# Patient Record
Sex: Female | Born: 1935
Health system: Southern US, Community
[De-identification: ages and names within clinical notes are randomized; demographics above are authoritative.]

## PROBLEM LIST (undated history)

## (undated) DIAGNOSIS — I4891 Unspecified atrial fibrillation: Secondary | ICD-10-CM

## (undated) DIAGNOSIS — E785 Hyperlipidemia, unspecified: Secondary | ICD-10-CM

## (undated) DIAGNOSIS — M1990 Unspecified osteoarthritis, unspecified site: Secondary | ICD-10-CM

## (undated) HISTORY — PX: TONSILLECTOMY: SUR1361

## (undated) HISTORY — PX: REPLACEMENT TOTAL KNEE: SUR1224

## (undated) HISTORY — PX: JOINT REPLACEMENT: SHX530

---

## 1998-09-01 HISTORY — PX: COLONOSCOPY: SHX174

## 2006-12-23 ENCOUNTER — Ambulatory Visit (HOSPITAL_COMMUNITY): Admission: RE | Admit: 2006-12-23 | Discharge: 2006-12-23 | Payer: Self-pay | Admitting: *Deleted

## 2007-12-28 ENCOUNTER — Ambulatory Visit (HOSPITAL_COMMUNITY): Admission: RE | Admit: 2007-12-28 | Discharge: 2007-12-28 | Payer: Self-pay | Admitting: Family Medicine

## 2009-02-05 ENCOUNTER — Ambulatory Visit (HOSPITAL_COMMUNITY): Admission: RE | Admit: 2009-02-05 | Discharge: 2009-02-05 | Payer: Self-pay | Admitting: Ophthalmology

## 2009-12-03 ENCOUNTER — Ambulatory Visit (HOSPITAL_COMMUNITY): Admission: RE | Admit: 2009-12-03 | Discharge: 2009-12-03 | Payer: Self-pay | Admitting: Ophthalmology

## 2012-10-12 ENCOUNTER — Encounter (HOSPITAL_COMMUNITY): Payer: Self-pay | Admitting: Pharmacy Technician

## 2012-10-18 ENCOUNTER — Ambulatory Visit (HOSPITAL_COMMUNITY)
Admission: RE | Admit: 2012-10-18 | Discharge: 2012-10-18 | Disposition: A | Discharge status: Home / Self Care | Payer: Medicare Other | Source: Ambulatory Visit | Attending: Ophthalmology | Admitting: Ophthalmology

## 2012-10-18 ENCOUNTER — Encounter (HOSPITAL_COMMUNITY): Payer: Self-pay | Admitting: *Deleted

## 2012-10-18 ENCOUNTER — Encounter (HOSPITAL_COMMUNITY)
Admission: RE | Disposition: A | Discharge status: Home / Self Care | Payer: Self-pay | Source: Ambulatory Visit | Attending: Ophthalmology

## 2012-10-18 DIAGNOSIS — H26499 Other secondary cataract, unspecified eye: Secondary | ICD-10-CM | POA: Insufficient documentation

## 2012-10-18 HISTORY — PX: YAG LASER APPLICATION: SHX6189

## 2012-10-18 HISTORY — DX: Unspecified osteoarthritis, unspecified site: M19.90

## 2012-10-18 HISTORY — DX: Hyperlipidemia, unspecified: E78.5

## 2012-10-18 SURGERY — TREATMENT, USING YAG LASER
Anesthesia: Topical | Laterality: Right

## 2012-10-18 MED ORDER — TROPICAMIDE 1 % OP SOLN
OPHTHALMIC | Status: AC
  Filled 2012-10-18: qty 3

## 2012-10-18 MED ORDER — APRACLONIDINE HCL 1 % OP SOLN
OPHTHALMIC | Status: AC
  Filled 2012-10-18: qty 0.1

## 2012-10-18 MED ORDER — TETRACAINE HCL 0.5 % OP SOLN
1.0000 [drp] | Freq: Once | OPHTHALMIC | Status: AC
  Administered 2012-10-18: 1 [drp] via OPHTHALMIC

## 2012-10-18 MED ORDER — TROPICAMIDE 1 % OP SOLN
1.0000 [drp] | OPHTHALMIC | Status: AC
  Administered 2012-10-18 (×3): 1 [drp] via OPHTHALMIC

## 2012-10-18 MED ORDER — TETRACAINE HCL 0.5 % OP SOLN
OPHTHALMIC | Status: AC
  Filled 2012-10-18: qty 2

## 2012-10-18 MED ORDER — APRACLONIDINE HCL 1 % OP SOLN
1.0000 [drp] | OPHTHALMIC | Status: AC
  Administered 2012-10-18 (×2): 1 [drp] via OPHTHALMIC
  Administered 2012-10-18: 12:00:00 via OPHTHALMIC

## 2012-10-18 NOTE — H&P (Signed)
I have reviewed the pre printed H&P, the patient was re-examined, and I have identified no significant interval changes in the patient's medical condition.  There is no change in the plan of care since the history and physical of record. 

## 2012-10-18 NOTE — Brief Op Note (Signed)
Linda Lara 10/18/2012  Susa Simmonds, MD  Yag Laser Self Test Completedyes. Procedure: Posterior Capsulotomy, right eye.  Eye Protection Worn by Staff yes. Laser In Use Sign on Door yes.  Laser: Nd:YAG Spot Size: Fixed Burst Mode: III Power Setting: 2.2-2.6 mJ/burst Position treated: posterior capsule Number of shots: 96 Total energy delivered: 201 mJ  Patency of the capsulotomy was confirmed visually.  The patient tolerated the procedure without difficulty. No complications were encountered.   Patient verbalizes understanding of discharge instructions yes.   Notes:dense fibrous posterior capsule

## 2012-10-18 NOTE — Op Note (Signed)
None required

## 2012-10-20 ENCOUNTER — Encounter (HOSPITAL_COMMUNITY): Payer: Self-pay | Admitting: Ophthalmology

## 2015-10-18 DIAGNOSIS — E782 Mixed hyperlipidemia: Secondary | ICD-10-CM | POA: Diagnosis not present

## 2015-10-18 DIAGNOSIS — I10 Essential (primary) hypertension: Secondary | ICD-10-CM | POA: Diagnosis not present

## 2015-10-18 DIAGNOSIS — N183 Chronic kidney disease, stage 3 (moderate): Secondary | ICD-10-CM | POA: Diagnosis not present

## 2015-10-18 DIAGNOSIS — R5382 Chronic fatigue, unspecified: Secondary | ICD-10-CM | POA: Diagnosis not present

## 2015-10-25 DIAGNOSIS — N183 Chronic kidney disease, stage 3 (moderate): Secondary | ICD-10-CM | POA: Diagnosis not present

## 2015-10-25 DIAGNOSIS — Z6827 Body mass index (BMI) 27.0-27.9, adult: Secondary | ICD-10-CM | POA: Diagnosis not present

## 2015-10-25 DIAGNOSIS — I10 Essential (primary) hypertension: Secondary | ICD-10-CM | POA: Diagnosis not present

## 2015-10-25 DIAGNOSIS — M19041 Primary osteoarthritis, right hand: Secondary | ICD-10-CM | POA: Diagnosis not present

## 2015-10-25 DIAGNOSIS — Z0001 Encounter for general adult medical examination with abnormal findings: Secondary | ICD-10-CM | POA: Diagnosis not present

## 2015-10-25 DIAGNOSIS — E782 Mixed hyperlipidemia: Secondary | ICD-10-CM | POA: Diagnosis not present

## 2015-10-25 DIAGNOSIS — M19042 Primary osteoarthritis, left hand: Secondary | ICD-10-CM | POA: Diagnosis not present

## 2016-03-17 DIAGNOSIS — N183 Chronic kidney disease, stage 3 (moderate): Secondary | ICD-10-CM | POA: Diagnosis not present

## 2016-03-17 DIAGNOSIS — E782 Mixed hyperlipidemia: Secondary | ICD-10-CM | POA: Diagnosis not present

## 2016-03-17 DIAGNOSIS — I10 Essential (primary) hypertension: Secondary | ICD-10-CM | POA: Diagnosis not present

## 2016-03-20 DIAGNOSIS — N183 Chronic kidney disease, stage 3 (moderate): Secondary | ICD-10-CM | POA: Diagnosis not present

## 2016-03-20 DIAGNOSIS — M19042 Primary osteoarthritis, left hand: Secondary | ICD-10-CM | POA: Diagnosis not present

## 2016-03-20 DIAGNOSIS — I10 Essential (primary) hypertension: Secondary | ICD-10-CM | POA: Diagnosis not present

## 2016-03-20 DIAGNOSIS — M19041 Primary osteoarthritis, right hand: Secondary | ICD-10-CM | POA: Diagnosis not present

## 2016-03-20 DIAGNOSIS — Z6827 Body mass index (BMI) 27.0-27.9, adult: Secondary | ICD-10-CM | POA: Diagnosis not present

## 2016-03-20 DIAGNOSIS — E782 Mixed hyperlipidemia: Secondary | ICD-10-CM | POA: Diagnosis not present

## 2016-04-16 DIAGNOSIS — Z961 Presence of intraocular lens: Secondary | ICD-10-CM | POA: Diagnosis not present

## 2016-04-16 DIAGNOSIS — H26492 Other secondary cataract, left eye: Secondary | ICD-10-CM | POA: Diagnosis not present

## 2016-04-16 DIAGNOSIS — H401131 Primary open-angle glaucoma, bilateral, mild stage: Secondary | ICD-10-CM | POA: Diagnosis not present

## 2016-04-21 DIAGNOSIS — H401131 Primary open-angle glaucoma, bilateral, mild stage: Secondary | ICD-10-CM | POA: Diagnosis not present

## 2016-04-28 DIAGNOSIS — H401131 Primary open-angle glaucoma, bilateral, mild stage: Secondary | ICD-10-CM | POA: Diagnosis not present

## 2016-05-08 DIAGNOSIS — Z1231 Encounter for screening mammogram for malignant neoplasm of breast: Secondary | ICD-10-CM | POA: Diagnosis not present

## 2016-05-20 DIAGNOSIS — Z23 Encounter for immunization: Secondary | ICD-10-CM | POA: Diagnosis not present

## 2016-06-26 DIAGNOSIS — H401131 Primary open-angle glaucoma, bilateral, mild stage: Secondary | ICD-10-CM | POA: Diagnosis not present

## 2016-06-26 DIAGNOSIS — Z961 Presence of intraocular lens: Secondary | ICD-10-CM | POA: Diagnosis not present

## 2016-08-19 DIAGNOSIS — Z961 Presence of intraocular lens: Secondary | ICD-10-CM | POA: Diagnosis not present

## 2016-08-19 DIAGNOSIS — H401132 Primary open-angle glaucoma, bilateral, moderate stage: Secondary | ICD-10-CM | POA: Diagnosis not present

## 2016-09-30 DIAGNOSIS — Z961 Presence of intraocular lens: Secondary | ICD-10-CM | POA: Diagnosis not present

## 2016-09-30 DIAGNOSIS — H401132 Primary open-angle glaucoma, bilateral, moderate stage: Secondary | ICD-10-CM | POA: Diagnosis not present

## 2016-10-28 DIAGNOSIS — E782 Mixed hyperlipidemia: Secondary | ICD-10-CM | POA: Diagnosis not present

## 2016-10-28 DIAGNOSIS — N183 Chronic kidney disease, stage 3 (moderate): Secondary | ICD-10-CM | POA: Diagnosis not present

## 2016-10-28 DIAGNOSIS — I10 Essential (primary) hypertension: Secondary | ICD-10-CM | POA: Diagnosis not present

## 2016-10-28 DIAGNOSIS — R5382 Chronic fatigue, unspecified: Secondary | ICD-10-CM | POA: Diagnosis not present

## 2016-10-30 DIAGNOSIS — E782 Mixed hyperlipidemia: Secondary | ICD-10-CM | POA: Diagnosis not present

## 2016-10-30 DIAGNOSIS — Z0001 Encounter for general adult medical examination with abnormal findings: Secondary | ICD-10-CM | POA: Diagnosis not present

## 2016-10-30 DIAGNOSIS — I10 Essential (primary) hypertension: Secondary | ICD-10-CM | POA: Diagnosis not present

## 2016-10-30 DIAGNOSIS — N183 Chronic kidney disease, stage 3 (moderate): Secondary | ICD-10-CM | POA: Diagnosis not present

## 2017-02-03 DIAGNOSIS — Z961 Presence of intraocular lens: Secondary | ICD-10-CM | POA: Diagnosis not present

## 2017-02-03 DIAGNOSIS — H401132 Primary open-angle glaucoma, bilateral, moderate stage: Secondary | ICD-10-CM | POA: Diagnosis not present

## 2017-04-30 DIAGNOSIS — N183 Chronic kidney disease, stage 3 (moderate): Secondary | ICD-10-CM | POA: Diagnosis not present

## 2017-04-30 DIAGNOSIS — I10 Essential (primary) hypertension: Secondary | ICD-10-CM | POA: Diagnosis not present

## 2017-04-30 DIAGNOSIS — Z9189 Other specified personal risk factors, not elsewhere classified: Secondary | ICD-10-CM | POA: Diagnosis not present

## 2017-04-30 DIAGNOSIS — E782 Mixed hyperlipidemia: Secondary | ICD-10-CM | POA: Diagnosis not present

## 2017-05-05 DIAGNOSIS — I10 Essential (primary) hypertension: Secondary | ICD-10-CM | POA: Diagnosis not present

## 2017-05-05 DIAGNOSIS — N183 Chronic kidney disease, stage 3 (moderate): Secondary | ICD-10-CM | POA: Diagnosis not present

## 2017-05-05 DIAGNOSIS — M19042 Primary osteoarthritis, left hand: Secondary | ICD-10-CM | POA: Diagnosis not present

## 2017-05-05 DIAGNOSIS — E782 Mixed hyperlipidemia: Secondary | ICD-10-CM | POA: Diagnosis not present

## 2017-05-22 DIAGNOSIS — Z1231 Encounter for screening mammogram for malignant neoplasm of breast: Secondary | ICD-10-CM | POA: Diagnosis not present

## 2017-06-09 DIAGNOSIS — Z961 Presence of intraocular lens: Secondary | ICD-10-CM | POA: Diagnosis not present

## 2017-06-09 DIAGNOSIS — H31023 Solar retinopathy, bilateral: Secondary | ICD-10-CM | POA: Diagnosis not present

## 2017-06-09 DIAGNOSIS — H401132 Primary open-angle glaucoma, bilateral, moderate stage: Secondary | ICD-10-CM | POA: Diagnosis not present

## 2017-10-12 DIAGNOSIS — H401132 Primary open-angle glaucoma, bilateral, moderate stage: Secondary | ICD-10-CM | POA: Diagnosis not present

## 2017-10-12 DIAGNOSIS — H31023 Solar retinopathy, bilateral: Secondary | ICD-10-CM | POA: Diagnosis not present

## 2017-10-12 DIAGNOSIS — Z961 Presence of intraocular lens: Secondary | ICD-10-CM | POA: Diagnosis not present

## 2017-10-23 DIAGNOSIS — L89151 Pressure ulcer of sacral region, stage 1: Secondary | ICD-10-CM | POA: Diagnosis not present

## 2017-10-23 DIAGNOSIS — Z6827 Body mass index (BMI) 27.0-27.9, adult: Secondary | ICD-10-CM | POA: Diagnosis not present

## 2017-10-23 DIAGNOSIS — L03315 Cellulitis of perineum: Secondary | ICD-10-CM | POA: Diagnosis not present

## 2017-11-20 DIAGNOSIS — I10 Essential (primary) hypertension: Secondary | ICD-10-CM | POA: Diagnosis not present

## 2017-11-20 DIAGNOSIS — R5382 Chronic fatigue, unspecified: Secondary | ICD-10-CM | POA: Diagnosis not present

## 2017-11-20 DIAGNOSIS — E782 Mixed hyperlipidemia: Secondary | ICD-10-CM | POA: Diagnosis not present

## 2017-11-20 DIAGNOSIS — N183 Chronic kidney disease, stage 3 (moderate): Secondary | ICD-10-CM | POA: Diagnosis not present

## 2017-11-25 DIAGNOSIS — Z23 Encounter for immunization: Secondary | ICD-10-CM | POA: Diagnosis not present

## 2017-11-25 DIAGNOSIS — Z0001 Encounter for general adult medical examination with abnormal findings: Secondary | ICD-10-CM | POA: Diagnosis not present

## 2017-11-25 DIAGNOSIS — E782 Mixed hyperlipidemia: Secondary | ICD-10-CM | POA: Diagnosis not present

## 2017-11-25 DIAGNOSIS — I10 Essential (primary) hypertension: Secondary | ICD-10-CM | POA: Diagnosis not present

## 2018-02-17 DIAGNOSIS — Z961 Presence of intraocular lens: Secondary | ICD-10-CM | POA: Diagnosis not present

## 2018-02-17 DIAGNOSIS — H401132 Primary open-angle glaucoma, bilateral, moderate stage: Secondary | ICD-10-CM | POA: Diagnosis not present

## 2018-02-17 DIAGNOSIS — H31023 Solar retinopathy, bilateral: Secondary | ICD-10-CM | POA: Diagnosis not present

## 2018-05-25 DIAGNOSIS — Z9189 Other specified personal risk factors, not elsewhere classified: Secondary | ICD-10-CM | POA: Diagnosis not present

## 2018-05-25 DIAGNOSIS — E782 Mixed hyperlipidemia: Secondary | ICD-10-CM | POA: Diagnosis not present

## 2018-05-25 DIAGNOSIS — I10 Essential (primary) hypertension: Secondary | ICD-10-CM | POA: Diagnosis not present

## 2018-05-25 DIAGNOSIS — N183 Chronic kidney disease, stage 3 (moderate): Secondary | ICD-10-CM | POA: Diagnosis not present

## 2018-05-25 DIAGNOSIS — R5382 Chronic fatigue, unspecified: Secondary | ICD-10-CM | POA: Diagnosis not present

## 2018-05-28 DIAGNOSIS — M19042 Primary osteoarthritis, left hand: Secondary | ICD-10-CM | POA: Diagnosis not present

## 2018-05-28 DIAGNOSIS — N183 Chronic kidney disease, stage 3 (moderate): Secondary | ICD-10-CM | POA: Diagnosis not present

## 2018-05-28 DIAGNOSIS — E782 Mixed hyperlipidemia: Secondary | ICD-10-CM | POA: Diagnosis not present

## 2018-05-28 DIAGNOSIS — I10 Essential (primary) hypertension: Secondary | ICD-10-CM | POA: Diagnosis not present

## 2018-06-04 DIAGNOSIS — Z1231 Encounter for screening mammogram for malignant neoplasm of breast: Secondary | ICD-10-CM | POA: Diagnosis not present

## 2018-08-30 DIAGNOSIS — H401132 Primary open-angle glaucoma, bilateral, moderate stage: Secondary | ICD-10-CM | POA: Diagnosis not present

## 2018-08-30 DIAGNOSIS — Z961 Presence of intraocular lens: Secondary | ICD-10-CM | POA: Diagnosis not present

## 2018-08-30 DIAGNOSIS — H31023 Solar retinopathy, bilateral: Secondary | ICD-10-CM | POA: Diagnosis not present

## 2018-09-14 DIAGNOSIS — M9903 Segmental and somatic dysfunction of lumbar region: Secondary | ICD-10-CM | POA: Diagnosis not present

## 2018-09-14 DIAGNOSIS — S338XXA Sprain of other parts of lumbar spine and pelvis, initial encounter: Secondary | ICD-10-CM | POA: Diagnosis not present

## 2018-09-14 DIAGNOSIS — M47816 Spondylosis without myelopathy or radiculopathy, lumbar region: Secondary | ICD-10-CM | POA: Diagnosis not present

## 2018-09-16 DIAGNOSIS — M9903 Segmental and somatic dysfunction of lumbar region: Secondary | ICD-10-CM | POA: Diagnosis not present

## 2018-09-16 DIAGNOSIS — S338XXA Sprain of other parts of lumbar spine and pelvis, initial encounter: Secondary | ICD-10-CM | POA: Diagnosis not present

## 2018-09-16 DIAGNOSIS — M47816 Spondylosis without myelopathy or radiculopathy, lumbar region: Secondary | ICD-10-CM | POA: Diagnosis not present

## 2018-09-20 DIAGNOSIS — M47816 Spondylosis without myelopathy or radiculopathy, lumbar region: Secondary | ICD-10-CM | POA: Diagnosis not present

## 2018-09-20 DIAGNOSIS — S338XXA Sprain of other parts of lumbar spine and pelvis, initial encounter: Secondary | ICD-10-CM | POA: Diagnosis not present

## 2018-09-20 DIAGNOSIS — M9903 Segmental and somatic dysfunction of lumbar region: Secondary | ICD-10-CM | POA: Diagnosis not present

## 2018-09-23 DIAGNOSIS — S338XXA Sprain of other parts of lumbar spine and pelvis, initial encounter: Secondary | ICD-10-CM | POA: Diagnosis not present

## 2018-09-23 DIAGNOSIS — M9903 Segmental and somatic dysfunction of lumbar region: Secondary | ICD-10-CM | POA: Diagnosis not present

## 2018-09-23 DIAGNOSIS — M47816 Spondylosis without myelopathy or radiculopathy, lumbar region: Secondary | ICD-10-CM | POA: Diagnosis not present

## 2018-09-27 DIAGNOSIS — M47816 Spondylosis without myelopathy or radiculopathy, lumbar region: Secondary | ICD-10-CM | POA: Diagnosis not present

## 2018-09-27 DIAGNOSIS — M9903 Segmental and somatic dysfunction of lumbar region: Secondary | ICD-10-CM | POA: Diagnosis not present

## 2018-09-27 DIAGNOSIS — S338XXA Sprain of other parts of lumbar spine and pelvis, initial encounter: Secondary | ICD-10-CM | POA: Diagnosis not present

## 2018-09-30 DIAGNOSIS — S338XXA Sprain of other parts of lumbar spine and pelvis, initial encounter: Secondary | ICD-10-CM | POA: Diagnosis not present

## 2018-09-30 DIAGNOSIS — M9903 Segmental and somatic dysfunction of lumbar region: Secondary | ICD-10-CM | POA: Diagnosis not present

## 2018-09-30 DIAGNOSIS — M47816 Spondylosis without myelopathy or radiculopathy, lumbar region: Secondary | ICD-10-CM | POA: Diagnosis not present

## 2018-10-04 DIAGNOSIS — M47816 Spondylosis without myelopathy or radiculopathy, lumbar region: Secondary | ICD-10-CM | POA: Diagnosis not present

## 2018-10-04 DIAGNOSIS — S338XXA Sprain of other parts of lumbar spine and pelvis, initial encounter: Secondary | ICD-10-CM | POA: Diagnosis not present

## 2018-10-04 DIAGNOSIS — M9903 Segmental and somatic dysfunction of lumbar region: Secondary | ICD-10-CM | POA: Diagnosis not present

## 2018-10-11 DIAGNOSIS — S338XXA Sprain of other parts of lumbar spine and pelvis, initial encounter: Secondary | ICD-10-CM | POA: Diagnosis not present

## 2018-10-11 DIAGNOSIS — M47816 Spondylosis without myelopathy or radiculopathy, lumbar region: Secondary | ICD-10-CM | POA: Diagnosis not present

## 2018-10-11 DIAGNOSIS — M9903 Segmental and somatic dysfunction of lumbar region: Secondary | ICD-10-CM | POA: Diagnosis not present

## 2018-10-15 DIAGNOSIS — M47816 Spondylosis without myelopathy or radiculopathy, lumbar region: Secondary | ICD-10-CM | POA: Diagnosis not present

## 2018-10-15 DIAGNOSIS — S338XXA Sprain of other parts of lumbar spine and pelvis, initial encounter: Secondary | ICD-10-CM | POA: Diagnosis not present

## 2018-10-15 DIAGNOSIS — M9903 Segmental and somatic dysfunction of lumbar region: Secondary | ICD-10-CM | POA: Diagnosis not present

## 2018-10-18 DIAGNOSIS — M9903 Segmental and somatic dysfunction of lumbar region: Secondary | ICD-10-CM | POA: Diagnosis not present

## 2018-10-18 DIAGNOSIS — M47816 Spondylosis without myelopathy or radiculopathy, lumbar region: Secondary | ICD-10-CM | POA: Diagnosis not present

## 2018-10-18 DIAGNOSIS — S338XXA Sprain of other parts of lumbar spine and pelvis, initial encounter: Secondary | ICD-10-CM | POA: Diagnosis not present

## 2018-10-21 DIAGNOSIS — S338XXA Sprain of other parts of lumbar spine and pelvis, initial encounter: Secondary | ICD-10-CM | POA: Diagnosis not present

## 2018-10-21 DIAGNOSIS — M47816 Spondylosis without myelopathy or radiculopathy, lumbar region: Secondary | ICD-10-CM | POA: Diagnosis not present

## 2018-10-21 DIAGNOSIS — M9903 Segmental and somatic dysfunction of lumbar region: Secondary | ICD-10-CM | POA: Diagnosis not present

## 2018-10-25 DIAGNOSIS — M47816 Spondylosis without myelopathy or radiculopathy, lumbar region: Secondary | ICD-10-CM | POA: Diagnosis not present

## 2018-10-25 DIAGNOSIS — S338XXA Sprain of other parts of lumbar spine and pelvis, initial encounter: Secondary | ICD-10-CM | POA: Diagnosis not present

## 2018-10-25 DIAGNOSIS — M9903 Segmental and somatic dysfunction of lumbar region: Secondary | ICD-10-CM | POA: Diagnosis not present

## 2018-11-01 DIAGNOSIS — S338XXA Sprain of other parts of lumbar spine and pelvis, initial encounter: Secondary | ICD-10-CM | POA: Diagnosis not present

## 2018-11-01 DIAGNOSIS — M9903 Segmental and somatic dysfunction of lumbar region: Secondary | ICD-10-CM | POA: Diagnosis not present

## 2018-11-01 DIAGNOSIS — M47816 Spondylosis without myelopathy or radiculopathy, lumbar region: Secondary | ICD-10-CM | POA: Diagnosis not present

## 2018-11-08 DIAGNOSIS — M9903 Segmental and somatic dysfunction of lumbar region: Secondary | ICD-10-CM | POA: Diagnosis not present

## 2018-11-08 DIAGNOSIS — M9901 Segmental and somatic dysfunction of cervical region: Secondary | ICD-10-CM | POA: Diagnosis not present

## 2018-11-08 DIAGNOSIS — M9902 Segmental and somatic dysfunction of thoracic region: Secondary | ICD-10-CM | POA: Diagnosis not present

## 2018-11-08 DIAGNOSIS — M47812 Spondylosis without myelopathy or radiculopathy, cervical region: Secondary | ICD-10-CM | POA: Diagnosis not present

## 2018-11-15 DIAGNOSIS — M9901 Segmental and somatic dysfunction of cervical region: Secondary | ICD-10-CM | POA: Diagnosis not present

## 2018-11-15 DIAGNOSIS — M9902 Segmental and somatic dysfunction of thoracic region: Secondary | ICD-10-CM | POA: Diagnosis not present

## 2018-11-15 DIAGNOSIS — M47812 Spondylosis without myelopathy or radiculopathy, cervical region: Secondary | ICD-10-CM | POA: Diagnosis not present

## 2018-11-15 DIAGNOSIS — M9903 Segmental and somatic dysfunction of lumbar region: Secondary | ICD-10-CM | POA: Diagnosis not present

## 2018-11-29 DIAGNOSIS — M9902 Segmental and somatic dysfunction of thoracic region: Secondary | ICD-10-CM | POA: Diagnosis not present

## 2018-11-29 DIAGNOSIS — M9901 Segmental and somatic dysfunction of cervical region: Secondary | ICD-10-CM | POA: Diagnosis not present

## 2018-11-29 DIAGNOSIS — I10 Essential (primary) hypertension: Secondary | ICD-10-CM | POA: Diagnosis not present

## 2018-11-29 DIAGNOSIS — M47812 Spondylosis without myelopathy or radiculopathy, cervical region: Secondary | ICD-10-CM | POA: Diagnosis not present

## 2018-11-29 DIAGNOSIS — E782 Mixed hyperlipidemia: Secondary | ICD-10-CM | POA: Diagnosis not present

## 2018-11-29 DIAGNOSIS — N183 Chronic kidney disease, stage 3 (moderate): Secondary | ICD-10-CM | POA: Diagnosis not present

## 2018-11-29 DIAGNOSIS — E042 Nontoxic multinodular goiter: Secondary | ICD-10-CM | POA: Diagnosis not present

## 2018-11-29 DIAGNOSIS — M9903 Segmental and somatic dysfunction of lumbar region: Secondary | ICD-10-CM | POA: Diagnosis not present

## 2018-12-03 DIAGNOSIS — Z0001 Encounter for general adult medical examination with abnormal findings: Secondary | ICD-10-CM | POA: Diagnosis not present

## 2018-12-03 DIAGNOSIS — I10 Essential (primary) hypertension: Secondary | ICD-10-CM | POA: Diagnosis not present

## 2018-12-03 DIAGNOSIS — M19042 Primary osteoarthritis, left hand: Secondary | ICD-10-CM | POA: Diagnosis not present

## 2018-12-03 DIAGNOSIS — N183 Chronic kidney disease, stage 3 (moderate): Secondary | ICD-10-CM | POA: Diagnosis not present

## 2018-12-06 DIAGNOSIS — M9901 Segmental and somatic dysfunction of cervical region: Secondary | ICD-10-CM | POA: Diagnosis not present

## 2018-12-06 DIAGNOSIS — M9902 Segmental and somatic dysfunction of thoracic region: Secondary | ICD-10-CM | POA: Diagnosis not present

## 2018-12-06 DIAGNOSIS — M47812 Spondylosis without myelopathy or radiculopathy, cervical region: Secondary | ICD-10-CM | POA: Diagnosis not present

## 2018-12-06 DIAGNOSIS — M9903 Segmental and somatic dysfunction of lumbar region: Secondary | ICD-10-CM | POA: Diagnosis not present

## 2019-02-24 DIAGNOSIS — R1011 Right upper quadrant pain: Secondary | ICD-10-CM | POA: Diagnosis not present

## 2019-02-24 DIAGNOSIS — Z6827 Body mass index (BMI) 27.0-27.9, adult: Secondary | ICD-10-CM | POA: Diagnosis not present

## 2019-03-01 DIAGNOSIS — H401132 Primary open-angle glaucoma, bilateral, moderate stage: Secondary | ICD-10-CM | POA: Diagnosis not present

## 2019-03-01 DIAGNOSIS — K828 Other specified diseases of gallbladder: Secondary | ICD-10-CM | POA: Diagnosis not present

## 2019-03-01 DIAGNOSIS — N261 Atrophy of kidney (terminal): Secondary | ICD-10-CM | POA: Diagnosis not present

## 2019-03-01 DIAGNOSIS — H31023 Solar retinopathy, bilateral: Secondary | ICD-10-CM | POA: Diagnosis not present

## 2019-03-01 DIAGNOSIS — K8020 Calculus of gallbladder without cholecystitis without obstruction: Secondary | ICD-10-CM | POA: Diagnosis not present

## 2019-03-01 DIAGNOSIS — N2889 Other specified disorders of kidney and ureter: Secondary | ICD-10-CM | POA: Diagnosis not present

## 2019-03-01 DIAGNOSIS — Z961 Presence of intraocular lens: Secondary | ICD-10-CM | POA: Diagnosis not present

## 2019-03-01 DIAGNOSIS — R1011 Right upper quadrant pain: Secondary | ICD-10-CM | POA: Diagnosis not present

## 2019-03-02 HISTORY — PX: CHOLECYSTECTOMY: SHX55

## 2019-03-02 HISTORY — PX: ERCP: SHX60

## 2019-03-07 DIAGNOSIS — K8050 Calculus of bile duct without cholangitis or cholecystitis without obstruction: Secondary | ICD-10-CM | POA: Diagnosis not present

## 2019-03-09 DIAGNOSIS — M810 Age-related osteoporosis without current pathological fracture: Secondary | ICD-10-CM | POA: Diagnosis not present

## 2019-03-09 DIAGNOSIS — M1990 Unspecified osteoarthritis, unspecified site: Secondary | ICD-10-CM | POA: Diagnosis not present

## 2019-03-09 DIAGNOSIS — Z1159 Encounter for screening for other viral diseases: Secondary | ICD-10-CM | POA: Diagnosis not present

## 2019-03-09 DIAGNOSIS — I10 Essential (primary) hypertension: Secondary | ICD-10-CM | POA: Diagnosis not present

## 2019-03-09 DIAGNOSIS — K8046 Calculus of bile duct with acute and chronic cholecystitis without obstruction: Secondary | ICD-10-CM | POA: Diagnosis not present

## 2019-03-09 DIAGNOSIS — Z888 Allergy status to other drugs, medicaments and biological substances status: Secondary | ICD-10-CM | POA: Diagnosis not present

## 2019-03-09 DIAGNOSIS — Z79899 Other long term (current) drug therapy: Secondary | ICD-10-CM | POA: Diagnosis not present

## 2019-03-09 DIAGNOSIS — Z882 Allergy status to sulfonamides status: Secondary | ICD-10-CM | POA: Diagnosis not present

## 2019-03-09 DIAGNOSIS — E7800 Pure hypercholesterolemia, unspecified: Secondary | ICD-10-CM | POA: Diagnosis not present

## 2019-03-09 DIAGNOSIS — Z96653 Presence of artificial knee joint, bilateral: Secondary | ICD-10-CM | POA: Diagnosis not present

## 2019-03-11 DIAGNOSIS — K8050 Calculus of bile duct without cholangitis or cholecystitis without obstruction: Secondary | ICD-10-CM | POA: Diagnosis not present

## 2019-03-11 DIAGNOSIS — K8010 Calculus of gallbladder with chronic cholecystitis without obstruction: Secondary | ICD-10-CM | POA: Diagnosis not present

## 2019-03-11 DIAGNOSIS — I10 Essential (primary) hypertension: Secondary | ICD-10-CM | POA: Diagnosis not present

## 2019-03-11 DIAGNOSIS — K812 Acute cholecystitis with chronic cholecystitis: Secondary | ICD-10-CM | POA: Diagnosis not present

## 2019-03-11 DIAGNOSIS — M810 Age-related osteoporosis without current pathological fracture: Secondary | ICD-10-CM | POA: Diagnosis not present

## 2019-03-11 DIAGNOSIS — K8046 Calculus of bile duct with acute and chronic cholecystitis without obstruction: Secondary | ICD-10-CM | POA: Diagnosis not present

## 2019-03-11 DIAGNOSIS — Z79899 Other long term (current) drug therapy: Secondary | ICD-10-CM | POA: Diagnosis not present

## 2019-03-11 DIAGNOSIS — E7800 Pure hypercholesterolemia, unspecified: Secondary | ICD-10-CM | POA: Diagnosis not present

## 2019-03-11 DIAGNOSIS — M1990 Unspecified osteoarthritis, unspecified site: Secondary | ICD-10-CM | POA: Diagnosis not present

## 2019-03-11 DIAGNOSIS — Z882 Allergy status to sulfonamides status: Secondary | ICD-10-CM | POA: Diagnosis not present

## 2019-03-11 DIAGNOSIS — Z96653 Presence of artificial knee joint, bilateral: Secondary | ICD-10-CM | POA: Diagnosis not present

## 2019-03-11 DIAGNOSIS — Z888 Allergy status to other drugs, medicaments and biological substances status: Secondary | ICD-10-CM | POA: Diagnosis not present

## 2019-03-12 DIAGNOSIS — E7800 Pure hypercholesterolemia, unspecified: Secondary | ICD-10-CM | POA: Diagnosis not present

## 2019-03-12 DIAGNOSIS — I10 Essential (primary) hypertension: Secondary | ICD-10-CM | POA: Diagnosis not present

## 2019-03-15 DIAGNOSIS — K851 Biliary acute pancreatitis without necrosis or infection: Secondary | ICD-10-CM | POA: Diagnosis not present

## 2019-03-15 DIAGNOSIS — R52 Pain, unspecified: Secondary | ICD-10-CM | POA: Diagnosis not present

## 2019-03-15 DIAGNOSIS — J189 Pneumonia, unspecified organism: Secondary | ICD-10-CM | POA: Diagnosis not present

## 2019-03-15 DIAGNOSIS — Z209 Contact with and (suspected) exposure to unspecified communicable disease: Secondary | ICD-10-CM | POA: Diagnosis not present

## 2019-03-15 DIAGNOSIS — K8050 Calculus of bile duct without cholangitis or cholecystitis without obstruction: Secondary | ICD-10-CM | POA: Diagnosis not present

## 2019-03-15 DIAGNOSIS — K838 Other specified diseases of biliary tract: Secondary | ICD-10-CM | POA: Diagnosis not present

## 2019-03-15 DIAGNOSIS — K8051 Calculus of bile duct without cholangitis or cholecystitis with obstruction: Secondary | ICD-10-CM | POA: Diagnosis not present

## 2019-03-15 DIAGNOSIS — E871 Hypo-osmolality and hyponatremia: Secondary | ICD-10-CM | POA: Diagnosis not present

## 2019-03-15 DIAGNOSIS — K8511 Biliary acute pancreatitis with uninfected necrosis: Secondary | ICD-10-CM | POA: Diagnosis not present

## 2019-03-15 DIAGNOSIS — R069 Unspecified abnormalities of breathing: Secondary | ICD-10-CM | POA: Diagnosis not present

## 2019-03-15 DIAGNOSIS — K8020 Calculus of gallbladder without cholecystitis without obstruction: Secondary | ICD-10-CM | POA: Diagnosis not present

## 2019-03-15 DIAGNOSIS — I10 Essential (primary) hypertension: Secondary | ICD-10-CM | POA: Diagnosis not present

## 2019-03-15 DIAGNOSIS — E785 Hyperlipidemia, unspecified: Secondary | ICD-10-CM | POA: Diagnosis not present

## 2019-03-15 DIAGNOSIS — I700 Atherosclerosis of aorta: Secondary | ICD-10-CM | POA: Diagnosis not present

## 2019-03-15 DIAGNOSIS — R1084 Generalized abdominal pain: Secondary | ICD-10-CM | POA: Diagnosis not present

## 2019-03-15 DIAGNOSIS — H409 Unspecified glaucoma: Secondary | ICD-10-CM | POA: Diagnosis not present

## 2019-03-16 DIAGNOSIS — I10 Essential (primary) hypertension: Secondary | ICD-10-CM | POA: Diagnosis not present

## 2019-03-16 DIAGNOSIS — R935 Abnormal findings on diagnostic imaging of other abdominal regions, including retroperitoneum: Secondary | ICD-10-CM | POA: Diagnosis not present

## 2019-03-16 DIAGNOSIS — K8051 Calculus of bile duct without cholangitis or cholecystitis with obstruction: Secondary | ICD-10-CM | POA: Diagnosis not present

## 2019-03-16 DIAGNOSIS — K838 Other specified diseases of biliary tract: Secondary | ICD-10-CM | POA: Diagnosis not present

## 2019-03-16 DIAGNOSIS — E785 Hyperlipidemia, unspecified: Secondary | ICD-10-CM | POA: Diagnosis not present

## 2019-03-16 DIAGNOSIS — R1011 Right upper quadrant pain: Secondary | ICD-10-CM | POA: Diagnosis not present

## 2019-03-16 DIAGNOSIS — K8511 Biliary acute pancreatitis with uninfected necrosis: Secondary | ICD-10-CM | POA: Diagnosis not present

## 2019-03-16 DIAGNOSIS — H409 Unspecified glaucoma: Secondary | ICD-10-CM | POA: Diagnosis not present

## 2019-03-16 DIAGNOSIS — Z9889 Other specified postprocedural states: Secondary | ICD-10-CM | POA: Diagnosis not present

## 2019-03-16 DIAGNOSIS — K589 Irritable bowel syndrome without diarrhea: Secondary | ICD-10-CM | POA: Diagnosis not present

## 2019-03-16 DIAGNOSIS — K449 Diaphragmatic hernia without obstruction or gangrene: Secondary | ICD-10-CM | POA: Diagnosis not present

## 2019-03-16 DIAGNOSIS — K8050 Calculus of bile duct without cholangitis or cholecystitis without obstruction: Secondary | ICD-10-CM | POA: Diagnosis not present

## 2019-03-16 DIAGNOSIS — K831 Obstruction of bile duct: Secondary | ICD-10-CM | POA: Diagnosis not present

## 2019-03-16 DIAGNOSIS — Z1159 Encounter for screening for other viral diseases: Secondary | ICD-10-CM | POA: Diagnosis not present

## 2019-04-01 DIAGNOSIS — R7989 Other specified abnormal findings of blood chemistry: Secondary | ICD-10-CM | POA: Diagnosis not present

## 2019-04-03 DIAGNOSIS — Z882 Allergy status to sulfonamides status: Secondary | ICD-10-CM | POA: Diagnosis not present

## 2019-04-03 DIAGNOSIS — K529 Noninfective gastroenteritis and colitis, unspecified: Secondary | ICD-10-CM | POA: Diagnosis not present

## 2019-04-03 DIAGNOSIS — Z885 Allergy status to narcotic agent status: Secondary | ICD-10-CM | POA: Diagnosis not present

## 2019-04-03 DIAGNOSIS — R197 Diarrhea, unspecified: Secondary | ICD-10-CM | POA: Diagnosis not present

## 2019-04-03 DIAGNOSIS — I10 Essential (primary) hypertension: Secondary | ICD-10-CM | POA: Diagnosis not present

## 2019-04-03 DIAGNOSIS — E860 Dehydration: Secondary | ICD-10-CM | POA: Diagnosis not present

## 2019-04-03 DIAGNOSIS — E7800 Pure hypercholesterolemia, unspecified: Secondary | ICD-10-CM | POA: Diagnosis not present

## 2019-04-03 DIAGNOSIS — M810 Age-related osteoporosis without current pathological fracture: Secondary | ICD-10-CM | POA: Diagnosis not present

## 2019-04-17 DIAGNOSIS — K8050 Calculus of bile duct without cholangitis or cholecystitis without obstruction: Secondary | ICD-10-CM | POA: Diagnosis not present

## 2019-05-06 DIAGNOSIS — Z23 Encounter for immunization: Secondary | ICD-10-CM | POA: Diagnosis not present

## 2019-05-06 DIAGNOSIS — R109 Unspecified abdominal pain: Secondary | ICD-10-CM | POA: Diagnosis not present

## 2019-05-06 DIAGNOSIS — Z6826 Body mass index (BMI) 26.0-26.9, adult: Secondary | ICD-10-CM | POA: Diagnosis not present

## 2019-05-17 ENCOUNTER — Ambulatory Visit: Payer: Medicare Other | Admitting: Gastroenterology

## 2019-05-17 ENCOUNTER — Encounter: Payer: Self-pay | Admitting: Gastroenterology

## 2019-05-17 ENCOUNTER — Other Ambulatory Visit: Payer: Self-pay

## 2019-05-17 DIAGNOSIS — K580 Irritable bowel syndrome with diarrhea: Secondary | ICD-10-CM

## 2019-05-17 DIAGNOSIS — K589 Irritable bowel syndrome without diarrhea: Secondary | ICD-10-CM | POA: Insufficient documentation

## 2019-05-17 MED ORDER — DICYCLOMINE HCL 10 MG PO CAPS: ORAL_CAPSULE | ORAL | 0 refills | Status: DC

## 2019-05-17 NOTE — Progress Notes (Signed)
Primary Care Physician:  Richardean Chimeraaniel, Terry G, MD  Primary Gastroenterologist:  Roetta SessionsMichael Rourk, MD   Chief Complaint  Patient presents with  . Abdominal Pain    Left side cramping going on for a while off/on  . Diarrhea    last episode was sunday. Off/on x 30 yrs    HPI:  Linda Lara is a 85 y.o. female here at the request of Dr. Reuel Boomaniel for further evaluation of abdominal pain. Patient has longstanding IBS-D dating back over 30 years ago. Previously seen by Dr. Jena Gaussourk in the 1990s. More recently she had cholecystectomy in 03/2019 and required ERCP with stent placement at [facility]. Due to go back for stent removal next month.   She presents to us for left sided abdominal pain and diarrhea.  Since her husband died recently she has been living at a senior citizen apartments.  She is worried that she is going to mess the carpet up in her apartment. She has uncontrollable diarrhea at times.  Given prior to her recent cholecystectomy her symptoms were worsening.  She wonders if she needs a colonoscopy.  She will have left abdominal crampy type pain which migrates from the upper left side down to the left lower quadrant followed by explosive diarrhea.  She may have several episodes in a day and then may go days without a bowel movement.  Typically takes 2 Imodium when the symptoms start.  Denies taking any laxatives.  Rarely has a solid stool even if she goes several days without a bowel movement.  Denies bright red blood per rectum.  Recently she had black stools but figured out that this was related to a liquid antidiarrheal likely equivalent to Pepto-Bismol.  States over the past 30 years she has lived on Imodium.  Imodium typically will help her diarrhea and abdominal pain.  She became concerned because of again worried about damaging the carpet in her apartment due to incontinence as well as worsening symptoms.  Her appetite remains good.  She denies any unintentional weight loss.  No significant upper GI  symptoms.  Current Outpatient Medications  Medication Sig Dispense Refill  . Loperamide HCl (IMODIUM PO) Take by mouth as needed.    . simvastatin (ZOCOR) 40 MG tablet Take 40 mg by mouth every evening.    . triamterene-hydrochlorothiazide (DYAZIDE) 37.5-25 MG per capsule Take 1 capsule by mouth every morning.     No current facility-administered medications for this visit.     Allergies as of 05/17/2019 - Review Complete 05/17/2019  Allergen Reaction Noted  . Celebrex [celecoxib] Rash 10/12/2012  . Sulfa antibiotics Rash 10/12/2012    Past Medical History:  Diagnosis Date  . Arthritis   . Hyperlipemia     Past Surgical History:  Procedure Laterality Date  . CHOLECYSTECTOMY  03/2019  . COLONOSCOPY  2000  . ERCP  03/2019  . JOINT REPLACEMENT Bilateral   . REPLACEMENT TOTAL KNEE Bilateral   . TONSILLECTOMY    . YAG LASER APPLICATION Right 10/18/2012   Procedure: YAG LASER APPLICATION;  Surgeon: Susa Simmondsarroll F Haines, MD;  Location: [facility];  Service: Ophthalmology;  Laterality: Right;    History reviewed. No pertinent family history.  Social History   Socioeconomic History  . Marital status: Married    Spouse name: Not on file  . Number of children: Not on file  . Years of education: Not on file  . Highest education level: Not on file  Occupational History  . Not on file  Social  Needs  . Financial resource strain: Not on file  . Food insecurity    Worry: Not on file    Inability: Not on file  . Transportation needs    Medical: Not on file    Non-medical: Not on file  Tobacco Use  . Smoking status: Never Smoker  . Smokeless tobacco: Never Used  Substance and Sexual Activity  . Alcohol use: Never    Frequency: Never  . Drug use: Never  . Sexual activity: Not on file  Lifestyle  . Physical activity    Days per week: Not on file    Minutes per session: Not on file  . Stress: Not on file  Relationships  . Social Herbalist on phone: Not on file     Gets together: Not on file    Attends religious service: Not on file    Active member of club or organization: Not on file    Attends meetings of clubs or organizations: Not on file    Relationship status: Not on file  . Intimate partner violence    Fear of current or ex partner: Not on file    Emotionally abused: Not on file    Physically abused: Not on file    Forced sexual activity: Not on file  Other Topics Concern  . Not on file  Social History Narrative  . Not on file      ROS:  General: Negative for anorexia, weight loss, fever, chills, fatigue, weakness. Eyes: Negative for vision changes.  ENT: Negative for hoarseness, difficulty swallowing , nasal congestion. CV: Negative for chest pain, angina, palpitations, dyspnea on exertion, peripheral edema.  Respiratory: Negative for dyspnea at rest, dyspnea on exertion, cough, sputum, wheezing.  GI: See history of present illness. GU:  Negative for dysuria, hematuria, urinary incontinence, urinary frequency, nocturnal urination.  MS: Negative for joint pain, low back pain.  Derm: Negative for rash or itching.  Neuro: Negative for weakness, abnormal sensation, seizure, frequent headaches, memory loss, confusion.  Psych: Negative for anxiety, depression, suicidal ideation, hallucinations.  Endo: Negative for unusual weight change.  Heme: Negative for bruising or bleeding. Allergy: Negative for rash or hives.    Physical Examination:  BP 124/87   Pulse 73   Temp 97.8 F (36.6 C) (Oral)   Ht 6' (1.829 m)   Wt 174 lb (78.9 kg)   BMI 23.60 kg/m    General: Well-nourished, well-developed in no acute distress.  Head: Normocephalic, atraumatic.   Eyes: Conjunctiva pink, no icterus. Mouth: masked. Neck: Supple without thyromegaly, masses, or lymphadenopathy.  Lungs: Clear to auscultation bilaterally.  Heart: Regular rate and rhythm, no murmurs rubs or gallops.  Abdomen: Bowel sounds are normal, nontender, nondistended, no  hepatosplenomegaly or masses, no abdominal bruits or    hernia , no rebound or guarding.   Rectal: Not performed Extremities: No lower extremity edema. No clubbing or deformities.  Neuro: Alert and oriented x 4 , grossly normal neurologically.  Skin: Warm and dry, no rash or jaundice.   Psych: Alert and cooperative, normal mood and affect.  Labs: 05/06/2019: Glucose 117, BUN 26, creatinine 1.12, sodium 138, potassium 4.1, albumin 3.3, total bilirubin 1.3, alkaline phosphatase 110, AST 20, ALT 9, white blood cell count 8900, hemoglobin 11.6 normal, hematocrit 36.5, MCV 93, platelets 355,000.  Imaging Studies: No results found.

## 2019-05-17 NOTE — Patient Instructions (Signed)
1. Trial of bentyl, 1 capsule up to twice daily as needed for diarrhea, abdominal cramps. Caution as this medication can cause drowsiness especially when taken regular at higher dosages. Monitor for symptoms.  2. If you would prefer to be more proactive INSTEAD of Bentyl you could try Imodium 1/2 tablet every morning to prevent diarrhea. If you develop diarrhea on certain days you could still taking additional imodium up to 2-3 tablets in 24 hour period. HOLD for constipation.  3. We will review your records and call with further recommendations.

## 2019-05-18 NOTE — Assessment & Plan Note (Addendum)
85 y/o female with several decade long history of IBS-D presenting for worsening of symptoms. She notes her symptoms were more severe even before her cholecystectomy in 03/2019. She is due for biliary stent removal next month at [facility]. She complains of several episodes of explosive diarrhea per day but then can go days without a BM. Previously reported stools as black but due to antidiarrheal equivalent to Pepto. This cleared up with stopping medication. No evidence of anemia. Discussed possibility of superimposed bile salt diarrhea.   Trial of low dose bentyl as detailed under patient instructions. Other option of imodium 1/2 tablet daily to prevent diarrhea and additional dosages as needed. Marland Kitchen Retrieve copy of records (several imaging studies 03/2019). Further recommendations to follow.

## 2019-05-20 NOTE — Progress Notes (Signed)
MRI/MRCP dated March 15, 2019 at [facility] 10 x 12 mm stone identified in the distal common bile duct.  5.4 x 3.4 cm postoperative fluid collection identified in the gallbladder fossa.  If there is concern for bile leak, nuclear scintigraphy amended.  CT abdomen pelvis without contrast March 15, 2019 at [facility] Common bile duct dilated up to 13 mm neck to distal CBD/ampulla there is an oval hyperdense filling defect measuring 13 mm.  Small appendicolith at the tip of the appendix.  Postcholecystectomy with mild right upper quadrant inflammation including mild secondary inflammation of the hepatic flexure, perihepatic free fluid trace and postoperative hematoma.  Patient underwent laparoscopic cholecystectomy by Dr. Tye Maryland on July 10.  After discharge return to the ER with continued upper abdominal pain, nausea.  Repeat imaging obtained as outlined above.  Patient was transferred to [facility] health for retained common bile duct stone, need for ERCP.  Her course was complicated by biliary pancreatitis.  March 16, 2019 at [facility]: Total bilirubin 5, alkaline phosphatase 129, AST 309, ALT 133, lipase 258, hemoglobin 10.6, MCV 97, white blood cell count 7200, platelets 192,000  Underwent ERCP/EGD on July 16.  Small hiatal hernia with moderate gastric erythema.  Successful ERCP with biliary sphincterotomy, stone extraction and a 10 x 6 fully covered biliary stent placed.  Abdominal pain completely resolved after procedure.  At [facility] on March 18, 2019: Total bilirubin down to 1.4, alkaline phosphatase 339, AST 61, ALT 72.  Hemoglobin 10.4.

## 2019-05-22 NOTE — Progress Notes (Signed)
CC'ED TO PCP 

## 2019-05-26 DIAGNOSIS — Z885 Allergy status to narcotic agent status: Secondary | ICD-10-CM | POA: Diagnosis not present

## 2019-05-26 DIAGNOSIS — I10 Essential (primary) hypertension: Secondary | ICD-10-CM | POA: Diagnosis not present

## 2019-05-26 DIAGNOSIS — R109 Unspecified abdominal pain: Secondary | ICD-10-CM | POA: Diagnosis not present

## 2019-05-26 DIAGNOSIS — E7800 Pure hypercholesterolemia, unspecified: Secondary | ICD-10-CM | POA: Diagnosis not present

## 2019-05-26 DIAGNOSIS — R1011 Right upper quadrant pain: Secondary | ICD-10-CM | POA: Diagnosis not present

## 2019-05-26 DIAGNOSIS — Z882 Allergy status to sulfonamides status: Secondary | ICD-10-CM | POA: Diagnosis not present

## 2019-05-26 DIAGNOSIS — Z79899 Other long term (current) drug therapy: Secondary | ICD-10-CM | POA: Diagnosis not present

## 2019-05-31 DIAGNOSIS — E782 Mixed hyperlipidemia: Secondary | ICD-10-CM | POA: Diagnosis not present

## 2019-05-31 DIAGNOSIS — I10 Essential (primary) hypertension: Secondary | ICD-10-CM | POA: Diagnosis not present

## 2019-05-31 DIAGNOSIS — N183 Chronic kidney disease, stage 3 (moderate): Secondary | ICD-10-CM | POA: Diagnosis not present

## 2019-06-03 DIAGNOSIS — N189 Chronic kidney disease, unspecified: Secondary | ICD-10-CM | POA: Diagnosis not present

## 2019-06-03 DIAGNOSIS — E782 Mixed hyperlipidemia: Secondary | ICD-10-CM | POA: Diagnosis not present

## 2019-06-03 DIAGNOSIS — M19042 Primary osteoarthritis, left hand: Secondary | ICD-10-CM | POA: Diagnosis not present

## 2019-06-03 DIAGNOSIS — I10 Essential (primary) hypertension: Secondary | ICD-10-CM | POA: Diagnosis not present

## 2019-06-09 DIAGNOSIS — Z20828 Contact with and (suspected) exposure to other viral communicable diseases: Secondary | ICD-10-CM | POA: Diagnosis not present

## 2019-06-09 DIAGNOSIS — Z01812 Encounter for preprocedural laboratory examination: Secondary | ICD-10-CM | POA: Diagnosis not present

## 2019-06-09 DIAGNOSIS — K8050 Calculus of bile duct without cholangitis or cholecystitis without obstruction: Secondary | ICD-10-CM | POA: Diagnosis not present

## 2019-06-10 DIAGNOSIS — Z4589 Encounter for adjustment and management of other implanted devices: Secondary | ICD-10-CM | POA: Diagnosis not present

## 2019-06-10 DIAGNOSIS — Z9689 Presence of other specified functional implants: Secondary | ICD-10-CM | POA: Diagnosis not present

## 2019-06-10 DIAGNOSIS — K8050 Calculus of bile duct without cholangitis or cholecystitis without obstruction: Secondary | ICD-10-CM | POA: Diagnosis not present

## 2019-06-10 DIAGNOSIS — Z9049 Acquired absence of other specified parts of digestive tract: Secondary | ICD-10-CM | POA: Diagnosis not present

## 2019-06-10 DIAGNOSIS — Z4659 Encounter for fitting and adjustment of other gastrointestinal appliance and device: Secondary | ICD-10-CM | POA: Diagnosis not present

## 2019-06-11 ENCOUNTER — Telehealth: Payer: Self-pay | Admitting: Gastroenterology

## 2019-06-11 NOTE — Telephone Encounter (Signed)
Please let pt know, reviewed records (detailed under OV note).   Discussed with Dr. Gala Romney. If she is still having diarrhea, would offer colonoscopy with random colon biopsies (please put that in comments of TCS order).

## 2019-06-13 NOTE — Telephone Encounter (Signed)
Good news. She can keep [place] posted if any changes.

## 2019-06-13 NOTE — Telephone Encounter (Signed)
Called and informed pt of recommendation. She went to [facility] Friday and had stent removed from bile duct. They gave her Cholestyramine powder. New medication is helping and no diarrhea at this time. She will call office if any further problems.

## 2019-06-20 ENCOUNTER — Encounter (HOSPITAL_COMMUNITY): Payer: Self-pay | Admitting: Emergency Medicine

## 2019-06-20 ENCOUNTER — Emergency Department (HOSPITAL_COMMUNITY)
Admission: EM | Admit: 2019-06-20 | Discharge: 2019-06-20 | Disposition: A | Discharge status: Home / Self Care | Payer: Medicare Other | Attending: Emergency Medicine | Admitting: Emergency Medicine

## 2019-06-20 ENCOUNTER — Other Ambulatory Visit: Payer: Self-pay

## 2019-06-20 ENCOUNTER — Emergency Department (HOSPITAL_COMMUNITY): Payer: Medicare Other

## 2019-06-20 DIAGNOSIS — Z96653 Presence of artificial knee joint, bilateral: Secondary | ICD-10-CM | POA: Diagnosis not present

## 2019-06-20 DIAGNOSIS — K5900 Constipation, unspecified: Secondary | ICD-10-CM | POA: Diagnosis not present

## 2019-06-20 DIAGNOSIS — K5903 Drug induced constipation: Secondary | ICD-10-CM | POA: Insufficient documentation

## 2019-06-20 DIAGNOSIS — Z79899 Other long term (current) drug therapy: Secondary | ICD-10-CM | POA: Diagnosis not present

## 2019-06-20 DIAGNOSIS — Z7901 Long term (current) use of anticoagulants: Secondary | ICD-10-CM | POA: Diagnosis not present

## 2019-06-20 DIAGNOSIS — E876 Hypokalemia: Secondary | ICD-10-CM | POA: Insufficient documentation

## 2019-06-20 LAB — BASIC METABOLIC PANEL
Anion gap: 11 (ref 5–15)
BUN: 16 mg/dL (ref 8–23)
CO2: 25 mmol/L (ref 22–32)
Calcium: 8.2 mg/dL — ABNORMAL LOW (ref 8.9–10.3)
Chloride: 106 mmol/L (ref 98–111)
Creatinine, Ser: 0.81 mg/dL (ref 0.44–1.00)
GFR calc Af Amer: >60 mL/min (ref >60)
GFR calc non Af Amer: >60 mL/min (ref >60)
Glucose, Bld: 94 mg/dL (ref 70–99)
Potassium: 3.2 mmol/L — ABNORMAL LOW (ref 3.5–5.1)
Sodium: 142 mmol/L (ref 135–145)

## 2019-06-20 MED ORDER — BISACODYL 10 MG RE SUPP
10.0000 mg | Freq: Once | RECTAL | Status: AC
  Administered 2019-06-20: 13:00:00 10 mg via RECTAL
  Filled 2019-06-20: qty 1

## 2019-06-20 MED ORDER — POTASSIUM CHLORIDE CRYS ER 20 MEQ PO TBCR
20.0000 meq | EXTENDED_RELEASE_TABLET | Freq: Once | ORAL | Status: AC
  Administered 2019-06-20: 13:00:00 20 meq via ORAL
  Filled 2019-06-20: qty 1

## 2019-06-20 MED ORDER — POTASSIUM CHLORIDE CRYS ER 20 MEQ PO TBCR: 20.0000 meq | EXTENDED_RELEASE_TABLET | Freq: Every day | ORAL | 0 refills | Status: AC

## 2019-06-20 MED ORDER — BISACODYL 10 MG RE SUPP
10.0000 mg | Freq: Once | RECTAL | Status: AC
  Administered 2019-06-20: 10 mg via RECTAL
  Filled 2019-06-20: qty 1

## 2019-06-20 NOTE — Discharge Instructions (Addendum)
Keep your appointment with your primary doctor tomorrow as you have scheduled.  In the interim,  I recommend avoiding any medicine that can affect your bowels including the cholestyramine powder, the bentyl and the imodium.  Your xray is reassuring that you are not impacted today.  You may take the other suppository once you are home to facilitate another bowel movement.  Ask you MD tomorrow when he would recommend starting the cholestyramine (I would suggest not to unless you once again develop diarrhea, then perhaps at a lesser dose then you were taking previously).

## 2019-06-20 NOTE — ED Notes (Signed)
Suppository placed at bedside. EDP notified.

## 2019-06-20 NOTE — ED Notes (Signed)
Patient encourage to try to have a BM

## 2019-06-20 NOTE — ED Provider Notes (Signed)
[facility] EMERGENCY DEPARTMENT Provider Note   CSN: [id number] Arrival date & time: 06/20/19  5361     History   Chief Complaint Chief Complaint  Patient presents with  . Constipation    HPI Linda Lara is a 85 y.o. female with a history of hyperlipidemia, arthritis and IBS, constipation predominant presenting for evaluation of constipation.  She describes constipation for many many years until she had a cholecystectomy 3 months ago after which time she has had problems with persistent diarrhea.  She has been seen by GI here in [place] for this problem and was recommended either course of Bentyl or low-dose daily Imodium which she has tried but caused constipation.  During her recent follow-up with her surgeon in [place] she mentioned the diarrhea and was placed on cholestyramine powder 3 times daily dosing but endorses worsening constipation with this medication.  Her last full bowel movement was about 1 week ago.  She was able to pass too hard marbles yesterday but is unable to BMs since.  She denies abdominal pain but does feel some pressure at her rectum.  She has had no fevers or chills, nausea, vomiting, dysuria or other complaints.  She has taken no medication for the constipation with this most recent episode.  She has held her cholestyramine since yesterday.     The history is provided by the patient.    Past Medical History:  Diagnosis Date  . Arthritis   . Hyperlipemia     Patient Active Problem List   Diagnosis Date Noted  . IBS (irritable bowel syndrome) 05/17/2019    Past Surgical History:  Procedure Laterality Date  . CHOLECYSTECTOMY  03/2019  . COLONOSCOPY  2000  . ERCP  03/2019  . JOINT REPLACEMENT Bilateral   . REPLACEMENT TOTAL KNEE Bilateral   . TONSILLECTOMY    . YAG LASER APPLICATION Right 10/18/2012   Procedure: YAG LASER APPLICATION;  Surgeon: Susa Simmonds, MD;  Location: [facility];  Service: Ophthalmology;  Laterality: Right;      OB History   No obstetric history on file.      Home Medications    Prior to Admission medications   Medication Sig Start Date End Date Taking? Authorizing Provider  acetaminophen (TYLENOL) 500 MG tablet Take by mouth. 03/18/19  Yes [provider]  ELIQUIS 5 MG TABS tablet Take 5 mg by mouth 2 (two) times daily. 06/03/19   [provider]  metoprolol succinate (TOPROL-XL) 100 MG 24 hr tablet Take by mouth.    [provider]  potassium chloride SA (KLOR-CON) 20 MEQ tablet Take 1 tablet (20 mEq total) by mouth daily. 06/20/19   Burgess Amor, PA-C  simvastatin (ZOCOR) 40 MG tablet Take 40 mg by mouth every evening.    [provider]  triamterene-hydrochlorothiazide (DYAZIDE) 37.5-25 MG per capsule Take 1 capsule by mouth every morning.    [provider]  dicyclomine (BENTYL) 10 MG capsule Take one cap up to twice daily as needed for abdominal cramps/diarrhea. Hold for constipation. 05/17/19 06/20/19  Tiffany Kocher, PA-C    Family History No family history on file.  Social History Social History   Tobacco Use  . Smoking status: Never Smoker  . Smokeless tobacco: Never Used  Substance Use Topics  . Alcohol use: Never    Frequency: Never  . Drug use: Never     Allergies   Celebrex [celecoxib] and Sulfa antibiotics   Review of Systems Review of Systems  Constitutional:  Negative for chills and fever.  HENT: Negative for congestion and sore throat.   Eyes: Negative.   Respiratory: Negative for chest tightness and shortness of breath.   Cardiovascular: Negative for chest pain.  Gastrointestinal: Positive for constipation. Negative for abdominal distention, abdominal pain, nausea, rectal pain and vomiting.  Genitourinary: Negative.   Musculoskeletal: Negative for arthralgias, joint swelling and neck pain.  Skin: Negative.  Negative for rash and wound.  Neurological: Negative for dizziness, weakness, light-headedness, numbness  and headaches.  Psychiatric/Behavioral: Negative.      Physical Exam Updated Vital Signs BP 139/82   Pulse 67   Temp 98.1 F (36.7 C) (Oral)   Resp 15   Ht 6' (1.829 m)   Wt 77.1 kg   SpO2 100%   BMI 23.06 kg/m   Physical Exam Vitals signs and nursing note reviewed.  Constitutional:      Appearance: She is well-developed.  HENT:     Head: Normocephalic and atraumatic.  Eyes:     Conjunctiva/sclera: Conjunctivae normal.  Neck:     Musculoskeletal: Normal range of motion.  Cardiovascular:     Rate and Rhythm: Normal rate and regular rhythm.     Heart sounds: Normal heart sounds.  Pulmonary:     Effort: Pulmonary effort is normal.     Breath sounds: Normal breath sounds. No wheezing.  Abdominal:     General: Bowel sounds are normal. There is no distension.     Palpations: Abdomen is soft.     Tenderness: There is no abdominal tenderness.  Genitourinary:    Rectum: External hemorrhoid present.     Comments: Small external hemorrhoid.  She does have hard stool in the rectal vault but there does not appear to be an impaction present.  I was able to remove several small pieces of hard stool. Musculoskeletal: Normal range of motion.  Skin:    General: Skin is warm and dry.  Neurological:     Mental Status: She is alert.      ED Treatments / Results  Labs (all labs ordered are listed, but only abnormal results are displayed) Labs Reviewed  BASIC METABOLIC PANEL - Abnormal; Notable for the following components:      Result Value   Potassium 3.2 (*)    Calcium 8.2 (*)    All other components within normal limits    EKG None  Radiology Dg Abdomen 1 View  Result Date: 06/20/2019 CLINICAL DATA:  A constipation EXAM: ABDOMEN - 1 VIEW COMPARISON:  May 26, 2019 FINDINGS: There is fairly diffuse stool throughout the colon. The colon is not distended by stool, however. There is no bowel dilatation or air-fluid level to suggest bowel obstruction. No free air.  There are surgical clips in right upper quadrant. There is degenerative change in the lumbar spine. IMPRESSION: Fairly diffuse stool throughout the colon without colonic distension from the volume stool. No bowel obstruction or free air. Electronically Signed   By: Lowella Grip III M.D.   On: 06/20/2019 11:42    Procedures Procedures (including critical care time)  Medications Ordered in ED Medications  bisacodyl (DULCOLAX) suppository 10 mg (10 mg Rectal Given by Other 06/20/19 1130)  potassium chloride SA (KLOR-CON) CR tablet 20 mEq (20 mEq Oral Given 06/20/19 1310)  bisacodyl (DULCOLAX) suppository 10 mg (10 mg Rectal Given 06/20/19 1310)     Initial Impression / Assessment and Plan / ED Course  I have reviewed the triage vital signs and the nursing notes.  Pertinent  labs & imaging results that were available during my care of the patient were reviewed by me and considered in my medical decision making (see chart for details).        During digital disimpaction patient was given a Dulcolax suppository after which she was able to have a fairly large bowel movement.  She endorses at the end of her stool was still fairly hard.  She does not feel completely "cleaned out but denies any rectal pressure or pain at this time.  She was given an additional Dulcolax suppository for home use.  She states she has a follow-up with her PCP tomorrow.  She was advised to hold her cholestyramine until further advised by her PCP.  Her lab work also reveals a mild hypokalemia which was discussed with patient.  She was given a dose of potassium here and a short course of home supplementation for this condition.  Final Clinical Impressions(s) / ED Diagnoses   Final diagnoses:  Drug-induced constipation  Hypokalemia    ED Discharge Orders         Ordered    potassium chloride SA (KLOR-CON) 20 MEQ tablet  Daily     06/20/19 1258           Burgess Amordol, Wilfrido Luedke, PA-C 06/20/19 1509    Eber HongMiller, Brian,  MD 06/21/19 [phone]

## 2019-06-20 NOTE — ED Triage Notes (Signed)
Unable to have bowel movement, passed 2 hard pieces of stool yesterday, states she is unable to get the rest out.

## 2019-06-21 DIAGNOSIS — E782 Mixed hyperlipidemia: Secondary | ICD-10-CM | POA: Diagnosis not present

## 2019-06-21 DIAGNOSIS — N189 Chronic kidney disease, unspecified: Secondary | ICD-10-CM | POA: Diagnosis not present

## 2019-06-21 DIAGNOSIS — M19042 Primary osteoarthritis, left hand: Secondary | ICD-10-CM | POA: Diagnosis not present

## 2019-06-21 DIAGNOSIS — I10 Essential (primary) hypertension: Secondary | ICD-10-CM | POA: Diagnosis not present

## 2019-07-01 DIAGNOSIS — K5900 Constipation, unspecified: Secondary | ICD-10-CM | POA: Diagnosis not present

## 2019-07-01 DIAGNOSIS — Z885 Allergy status to narcotic agent status: Secondary | ICD-10-CM | POA: Diagnosis not present

## 2019-07-01 DIAGNOSIS — R109 Unspecified abdominal pain: Secondary | ICD-10-CM | POA: Diagnosis not present

## 2019-07-01 DIAGNOSIS — S0003XA Contusion of scalp, initial encounter: Secondary | ICD-10-CM | POA: Diagnosis not present

## 2019-07-01 DIAGNOSIS — E782 Mixed hyperlipidemia: Secondary | ICD-10-CM | POA: Diagnosis not present

## 2019-07-01 DIAGNOSIS — I4891 Unspecified atrial fibrillation: Secondary | ICD-10-CM | POA: Diagnosis not present

## 2019-07-01 DIAGNOSIS — Z7901 Long term (current) use of anticoagulants: Secondary | ICD-10-CM | POA: Diagnosis not present

## 2019-07-01 DIAGNOSIS — S3991XA Unspecified injury of abdomen, initial encounter: Secondary | ICD-10-CM | POA: Diagnosis not present

## 2019-07-01 DIAGNOSIS — Z79899 Other long term (current) drug therapy: Secondary | ICD-10-CM | POA: Diagnosis not present

## 2019-07-01 DIAGNOSIS — E7800 Pure hypercholesterolemia, unspecified: Secondary | ICD-10-CM | POA: Diagnosis not present

## 2019-07-01 DIAGNOSIS — I10 Essential (primary) hypertension: Secondary | ICD-10-CM | POA: Diagnosis not present

## 2019-07-01 DIAGNOSIS — I959 Hypotension, unspecified: Secondary | ICD-10-CM | POA: Diagnosis not present

## 2019-07-01 DIAGNOSIS — R55 Syncope and collapse: Secondary | ICD-10-CM | POA: Diagnosis not present

## 2019-07-01 DIAGNOSIS — S0990XA Unspecified injury of head, initial encounter: Secondary | ICD-10-CM | POA: Diagnosis not present

## 2019-07-01 DIAGNOSIS — M810 Age-related osteoporosis without current pathological fracture: Secondary | ICD-10-CM | POA: Diagnosis not present

## 2019-07-01 DIAGNOSIS — Z882 Allergy status to sulfonamides status: Secondary | ICD-10-CM | POA: Diagnosis not present

## 2019-07-01 DIAGNOSIS — W19XXXA Unspecified fall, initial encounter: Secondary | ICD-10-CM | POA: Diagnosis not present

## 2019-07-01 DIAGNOSIS — R0902 Hypoxemia: Secondary | ICD-10-CM | POA: Diagnosis not present

## 2019-07-07 DIAGNOSIS — Z7901 Long term (current) use of anticoagulants: Secondary | ICD-10-CM | POA: Diagnosis not present

## 2019-07-07 DIAGNOSIS — R531 Weakness: Secondary | ICD-10-CM | POA: Diagnosis not present

## 2019-07-07 DIAGNOSIS — F339 Major depressive disorder, recurrent, unspecified: Secondary | ICD-10-CM | POA: Diagnosis not present

## 2019-07-07 DIAGNOSIS — Z96653 Presence of artificial knee joint, bilateral: Secondary | ICD-10-CM | POA: Diagnosis not present

## 2019-07-07 DIAGNOSIS — Z9841 Cataract extraction status, right eye: Secondary | ICD-10-CM | POA: Diagnosis not present

## 2019-07-07 DIAGNOSIS — Z9049 Acquired absence of other specified parts of digestive tract: Secondary | ICD-10-CM | POA: Diagnosis not present

## 2019-07-07 DIAGNOSIS — T502X5A Adverse effect of carbonic-anhydrase inhibitors, benzothiadiazides and other diuretics, initial encounter: Secondary | ICD-10-CM | POA: Diagnosis not present

## 2019-07-07 DIAGNOSIS — I10 Essential (primary) hypertension: Secondary | ICD-10-CM | POA: Diagnosis not present

## 2019-07-07 DIAGNOSIS — N390 Urinary tract infection, site not specified: Secondary | ICD-10-CM | POA: Diagnosis not present

## 2019-07-07 DIAGNOSIS — Z961 Presence of intraocular lens: Secondary | ICD-10-CM | POA: Diagnosis not present

## 2019-07-07 DIAGNOSIS — N141 Nephropathy induced by other drugs, medicaments and biological substances: Secondary | ICD-10-CM | POA: Diagnosis not present

## 2019-07-07 DIAGNOSIS — S3992XA Unspecified injury of lower back, initial encounter: Secondary | ICD-10-CM | POA: Diagnosis not present

## 2019-07-07 DIAGNOSIS — R52 Pain, unspecified: Secondary | ICD-10-CM | POA: Diagnosis not present

## 2019-07-07 DIAGNOSIS — E785 Hyperlipidemia, unspecified: Secondary | ICD-10-CM | POA: Diagnosis not present

## 2019-07-07 DIAGNOSIS — Z882 Allergy status to sulfonamides status: Secondary | ICD-10-CM | POA: Diagnosis not present

## 2019-07-07 DIAGNOSIS — I959 Hypotension, unspecified: Secondary | ICD-10-CM | POA: Diagnosis not present

## 2019-07-07 DIAGNOSIS — S32130D Nondisplaced Zone III fracture of sacrum, subsequent encounter for fracture with routine healing: Secondary | ICD-10-CM | POA: Diagnosis not present

## 2019-07-07 DIAGNOSIS — Z9842 Cataract extraction status, left eye: Secondary | ICD-10-CM | POA: Diagnosis not present

## 2019-07-07 DIAGNOSIS — N179 Acute kidney failure, unspecified: Secondary | ICD-10-CM | POA: Diagnosis not present

## 2019-07-07 DIAGNOSIS — S3210XA Unspecified fracture of sacrum, initial encounter for closed fracture: Secondary | ICD-10-CM | POA: Diagnosis not present

## 2019-07-07 DIAGNOSIS — M545 Low back pain: Secondary | ICD-10-CM | POA: Diagnosis not present

## 2019-07-07 DIAGNOSIS — R0902 Hypoxemia: Secondary | ICD-10-CM | POA: Diagnosis not present

## 2019-07-07 DIAGNOSIS — I4821 Permanent atrial fibrillation: Secondary | ICD-10-CM | POA: Diagnosis not present

## 2019-07-16 DIAGNOSIS — H3530 Unspecified macular degeneration: Secondary | ICD-10-CM | POA: Diagnosis not present

## 2019-07-16 DIAGNOSIS — N189 Chronic kidney disease, unspecified: Secondary | ICD-10-CM | POA: Diagnosis not present

## 2019-07-16 DIAGNOSIS — I4821 Permanent atrial fibrillation: Secondary | ICD-10-CM | POA: Diagnosis not present

## 2019-07-16 DIAGNOSIS — B961 Klebsiella pneumoniae [K. pneumoniae] as the cause of diseases classified elsewhere: Secondary | ICD-10-CM | POA: Diagnosis not present

## 2019-07-16 DIAGNOSIS — F331 Major depressive disorder, recurrent, moderate: Secondary | ICD-10-CM | POA: Diagnosis not present

## 2019-07-16 DIAGNOSIS — E7800 Pure hypercholesterolemia, unspecified: Secondary | ICD-10-CM | POA: Diagnosis not present

## 2019-07-16 DIAGNOSIS — S0003XD Contusion of scalp, subsequent encounter: Secondary | ICD-10-CM | POA: Diagnosis not present

## 2019-07-16 DIAGNOSIS — I129 Hypertensive chronic kidney disease with stage 1 through stage 4 chronic kidney disease, or unspecified chronic kidney disease: Secondary | ICD-10-CM | POA: Diagnosis not present

## 2019-07-16 DIAGNOSIS — S0990XD Unspecified injury of head, subsequent encounter: Secondary | ICD-10-CM | POA: Diagnosis not present

## 2019-07-16 DIAGNOSIS — H409 Unspecified glaucoma: Secondary | ICD-10-CM | POA: Diagnosis not present

## 2019-07-16 DIAGNOSIS — S3219XD Other fracture of sacrum, subsequent encounter for fracture with routine healing: Secondary | ICD-10-CM | POA: Diagnosis not present

## 2019-07-16 DIAGNOSIS — N390 Urinary tract infection, site not specified: Secondary | ICD-10-CM | POA: Diagnosis not present

## 2019-07-16 DIAGNOSIS — I700 Atherosclerosis of aorta: Secondary | ICD-10-CM | POA: Diagnosis not present

## 2019-07-16 DIAGNOSIS — D631 Anemia in chronic kidney disease: Secondary | ICD-10-CM | POA: Diagnosis not present

## 2019-08-01 DIAGNOSIS — I10 Essential (primary) hypertension: Secondary | ICD-10-CM | POA: Diagnosis not present

## 2019-08-01 DIAGNOSIS — E782 Mixed hyperlipidemia: Secondary | ICD-10-CM | POA: Diagnosis not present

## 2019-08-08 DIAGNOSIS — E876 Hypokalemia: Secondary | ICD-10-CM | POA: Diagnosis not present

## 2019-08-08 DIAGNOSIS — I10 Essential (primary) hypertension: Secondary | ICD-10-CM | POA: Diagnosis not present

## 2019-08-08 DIAGNOSIS — I4891 Unspecified atrial fibrillation: Secondary | ICD-10-CM | POA: Diagnosis not present

## 2019-08-12 DIAGNOSIS — M19041 Primary osteoarthritis, right hand: Secondary | ICD-10-CM | POA: Diagnosis not present

## 2019-08-12 DIAGNOSIS — I10 Essential (primary) hypertension: Secondary | ICD-10-CM | POA: Diagnosis not present

## 2019-08-12 DIAGNOSIS — S3210XA Unspecified fracture of sacrum, initial encounter for closed fracture: Secondary | ICD-10-CM | POA: Diagnosis not present

## 2019-08-12 DIAGNOSIS — E876 Hypokalemia: Secondary | ICD-10-CM | POA: Diagnosis not present

## 2019-08-12 DIAGNOSIS — M19042 Primary osteoarthritis, left hand: Secondary | ICD-10-CM | POA: Diagnosis not present

## 2019-08-12 DIAGNOSIS — E782 Mixed hyperlipidemia: Secondary | ICD-10-CM | POA: Diagnosis not present

## 2019-08-25 DIAGNOSIS — S0003XD Contusion of scalp, subsequent encounter: Secondary | ICD-10-CM | POA: Diagnosis not present

## 2019-08-25 DIAGNOSIS — I4821 Permanent atrial fibrillation: Secondary | ICD-10-CM | POA: Diagnosis not present

## 2019-08-25 DIAGNOSIS — E7800 Pure hypercholesterolemia, unspecified: Secondary | ICD-10-CM | POA: Diagnosis not present

## 2019-08-25 DIAGNOSIS — S0990XD Unspecified injury of head, subsequent encounter: Secondary | ICD-10-CM | POA: Diagnosis not present

## 2019-08-25 DIAGNOSIS — S3219XD Other fracture of sacrum, subsequent encounter for fracture with routine healing: Secondary | ICD-10-CM | POA: Diagnosis not present

## 2019-08-25 DIAGNOSIS — F331 Major depressive disorder, recurrent, moderate: Secondary | ICD-10-CM | POA: Diagnosis not present

## 2019-08-25 DIAGNOSIS — I700 Atherosclerosis of aorta: Secondary | ICD-10-CM | POA: Diagnosis not present

## 2019-08-25 DIAGNOSIS — N390 Urinary tract infection, site not specified: Secondary | ICD-10-CM | POA: Diagnosis not present

## 2019-08-25 DIAGNOSIS — I129 Hypertensive chronic kidney disease with stage 1 through stage 4 chronic kidney disease, or unspecified chronic kidney disease: Secondary | ICD-10-CM | POA: Diagnosis not present

## 2019-08-25 DIAGNOSIS — N189 Chronic kidney disease, unspecified: Secondary | ICD-10-CM | POA: Diagnosis not present

## 2019-08-25 DIAGNOSIS — D631 Anemia in chronic kidney disease: Secondary | ICD-10-CM | POA: Diagnosis not present

## 2019-08-25 DIAGNOSIS — B961 Klebsiella pneumoniae [K. pneumoniae] as the cause of diseases classified elsewhere: Secondary | ICD-10-CM | POA: Diagnosis not present

## 2019-08-25 DIAGNOSIS — H3530 Unspecified macular degeneration: Secondary | ICD-10-CM | POA: Diagnosis not present

## 2019-08-25 DIAGNOSIS — H409 Unspecified glaucoma: Secondary | ICD-10-CM | POA: Diagnosis not present

## 2019-08-29 DIAGNOSIS — H401132 Primary open-angle glaucoma, bilateral, moderate stage: Secondary | ICD-10-CM | POA: Diagnosis not present

## 2019-08-29 DIAGNOSIS — Z961 Presence of intraocular lens: Secondary | ICD-10-CM | POA: Diagnosis not present

## 2019-08-29 DIAGNOSIS — H31023 Solar retinopathy, bilateral: Secondary | ICD-10-CM | POA: Diagnosis not present

## 2019-08-29 DIAGNOSIS — H43813 Vitreous degeneration, bilateral: Secondary | ICD-10-CM | POA: Diagnosis not present

## 2019-09-08 DIAGNOSIS — Z0120 Encounter for dental examination and cleaning without abnormal findings: Secondary | ICD-10-CM | POA: Diagnosis not present

## 2019-12-12 DIAGNOSIS — E876 Hypokalemia: Secondary | ICD-10-CM | POA: Diagnosis not present

## 2019-12-12 DIAGNOSIS — I10 Essential (primary) hypertension: Secondary | ICD-10-CM | POA: Diagnosis not present

## 2019-12-12 DIAGNOSIS — E782 Mixed hyperlipidemia: Secondary | ICD-10-CM | POA: Diagnosis not present

## 2019-12-12 DIAGNOSIS — N183 Chronic kidney disease, stage 3 unspecified: Secondary | ICD-10-CM | POA: Diagnosis not present

## 2019-12-16 DIAGNOSIS — I10 Essential (primary) hypertension: Secondary | ICD-10-CM | POA: Diagnosis not present

## 2019-12-16 DIAGNOSIS — Z0001 Encounter for general adult medical examination with abnormal findings: Secondary | ICD-10-CM | POA: Diagnosis not present

## 2019-12-16 DIAGNOSIS — M19041 Primary osteoarthritis, right hand: Secondary | ICD-10-CM | POA: Diagnosis not present

## 2019-12-16 DIAGNOSIS — M19042 Primary osteoarthritis, left hand: Secondary | ICD-10-CM | POA: Diagnosis not present

## 2020-02-14 DIAGNOSIS — H43813 Vitreous degeneration, bilateral: Secondary | ICD-10-CM | POA: Diagnosis not present

## 2020-02-14 DIAGNOSIS — H401132 Primary open-angle glaucoma, bilateral, moderate stage: Secondary | ICD-10-CM | POA: Diagnosis not present

## 2020-02-14 DIAGNOSIS — H31023 Solar retinopathy, bilateral: Secondary | ICD-10-CM | POA: Diagnosis not present

## 2020-02-14 DIAGNOSIS — Z961 Presence of intraocular lens: Secondary | ICD-10-CM | POA: Diagnosis not present

## 2020-03-19 DIAGNOSIS — Z0120 Encounter for dental examination and cleaning without abnormal findings: Secondary | ICD-10-CM | POA: Diagnosis not present

## 2020-05-21 DIAGNOSIS — E782 Mixed hyperlipidemia: Secondary | ICD-10-CM | POA: Diagnosis not present

## 2020-05-21 DIAGNOSIS — N183 Chronic kidney disease, stage 3 unspecified: Secondary | ICD-10-CM | POA: Diagnosis not present

## 2020-05-21 DIAGNOSIS — I10 Essential (primary) hypertension: Secondary | ICD-10-CM | POA: Diagnosis not present

## 2020-05-23 DIAGNOSIS — M19042 Primary osteoarthritis, left hand: Secondary | ICD-10-CM | POA: Diagnosis not present

## 2020-05-23 DIAGNOSIS — E782 Mixed hyperlipidemia: Secondary | ICD-10-CM | POA: Diagnosis not present

## 2020-05-23 DIAGNOSIS — I10 Essential (primary) hypertension: Secondary | ICD-10-CM | POA: Diagnosis not present

## 2020-05-23 DIAGNOSIS — M19041 Primary osteoarthritis, right hand: Secondary | ICD-10-CM | POA: Diagnosis not present

## 2020-07-16 DIAGNOSIS — M26609 Unspecified temporomandibular joint disorder, unspecified side: Secondary | ICD-10-CM | POA: Diagnosis not present

## 2020-07-18 ENCOUNTER — Emergency Department (HOSPITAL_BASED_OUTPATIENT_CLINIC_OR_DEPARTMENT_OTHER)
Admission: EM | Admit: 2020-07-18 | Discharge: 2020-07-18 | Disposition: A | Discharge status: Home / Self Care | Payer: Medicare Other | Attending: Emergency Medicine | Admitting: Emergency Medicine

## 2020-07-18 ENCOUNTER — Emergency Department (HOSPITAL_BASED_OUTPATIENT_CLINIC_OR_DEPARTMENT_OTHER): Payer: Medicare Other

## 2020-07-18 ENCOUNTER — Other Ambulatory Visit: Payer: Self-pay

## 2020-07-18 ENCOUNTER — Encounter (HOSPITAL_BASED_OUTPATIENT_CLINIC_OR_DEPARTMENT_OTHER): Payer: Self-pay

## 2020-07-18 DIAGNOSIS — R202 Paresthesia of skin: Secondary | ICD-10-CM | POA: Diagnosis not present

## 2020-07-18 DIAGNOSIS — R29818 Other symptoms and signs involving the nervous system: Secondary | ICD-10-CM | POA: Diagnosis not present

## 2020-07-18 DIAGNOSIS — Z7901 Long term (current) use of anticoagulants: Secondary | ICD-10-CM | POA: Insufficient documentation

## 2020-07-18 DIAGNOSIS — J320 Chronic maxillary sinusitis: Secondary | ICD-10-CM | POA: Diagnosis not present

## 2020-07-18 DIAGNOSIS — R519 Headache, unspecified: Secondary | ICD-10-CM

## 2020-07-18 DIAGNOSIS — Z79899 Other long term (current) drug therapy: Secondary | ICD-10-CM | POA: Diagnosis not present

## 2020-07-18 DIAGNOSIS — G9389 Other specified disorders of brain: Secondary | ICD-10-CM | POA: Diagnosis not present

## 2020-07-18 DIAGNOSIS — Z96653 Presence of artificial knee joint, bilateral: Secondary | ICD-10-CM | POA: Insufficient documentation

## 2020-07-18 DIAGNOSIS — R7989 Other specified abnormal findings of blood chemistry: Secondary | ICD-10-CM | POA: Diagnosis not present

## 2020-07-18 DIAGNOSIS — G319 Degenerative disease of nervous system, unspecified: Secondary | ICD-10-CM | POA: Diagnosis not present

## 2020-07-18 DIAGNOSIS — IMO0002 Reserved for concepts with insufficient information to code with codable children: Secondary | ICD-10-CM

## 2020-07-18 HISTORY — DX: Unspecified atrial fibrillation: I48.91

## 2020-07-18 LAB — BASIC METABOLIC PANEL
Anion gap: 10 (ref 5–15)
BUN: 27 mg/dL — ABNORMAL HIGH (ref 8–23)
CO2: 29 mmol/L (ref 22–32)
Calcium: 9.1 mg/dL (ref 8.9–10.3)
Chloride: 100 mmol/L (ref 98–111)
Creatinine, Ser: 1.44 mg/dL — ABNORMAL HIGH (ref 0.44–1.00)
GFR, Estimated: 34 mL/min — ABNORMAL LOW (ref >60)
Glucose, Bld: 107 mg/dL — ABNORMAL HIGH (ref 70–99)
Potassium: 4 mmol/L (ref 3.5–5.1)
Sodium: 139 mmol/L (ref 135–145)

## 2020-07-18 LAB — CBC WITH DIFFERENTIAL/PLATELET
Abs Immature Granulocytes: 0.02 10*3/uL (ref 0.00–0.07)
Basophils Absolute: 0 10*3/uL (ref 0.0–0.1)
Basophils Relative: 1 %
Eosinophils Absolute: 0 10*3/uL (ref 0.0–0.5)
Eosinophils Relative: 0 %
HCT: 36.6 % (ref 36.0–46.0)
Hemoglobin: 12.6 g/dL (ref 12.0–15.0)
Immature Granulocytes: 0 %
Lymphocytes Relative: 39 %
Lymphs Abs: 2.3 10*3/uL (ref 0.7–4.0)
MCH: 31.9 pg (ref 26.0–34.0)
MCHC: 34.4 g/dL (ref 30.0–36.0)
MCV: 92.7 fL (ref 80.0–100.0)
Monocytes Absolute: 0.7 10*3/uL (ref 0.1–1.0)
Monocytes Relative: 11 %
Neutro Abs: 3 10*3/uL (ref 1.7–7.7)
Neutrophils Relative %: 49 %
Platelets: 235 10*3/uL (ref 150–400)
RBC: 3.95 MIL/uL (ref 3.87–5.11)
RDW: 12.2 % (ref 11.5–15.5)
WBC: 6 10*3/uL (ref 4.0–10.5)
nRBC: 0 % (ref 0.0–0.2)

## 2020-07-18 NOTE — ED Triage Notes (Signed)
Pt and daughter report pt with pain to right temporal area "for months" per pt-reports area feeling numb x 6 weeks-pt reports she came to the ED today due to numbness has now spread to back of head and neck-denies injury to head or neck-NAD-slow steady gait

## 2020-07-18 NOTE — Discharge Instructions (Addendum)
Please schedule follow-up appointment with your primary doctor regarding your symptoms from today.  Your creatinine was mildly elevated.  Suspect this is either related to dehydration or chronic kidney disease.  Please stay hydrated, encourage fluids.  Please have your primary care doctor repeat this test sometime this coming week.  If you develop worsening headache, vomiting, fever, numbness, weakness or other new concerning symptom, return to ER for reassessment.

## 2020-07-18 NOTE — ED Provider Notes (Signed)
[facility] EMERGENCY DEPARTMENT Provider Note   CSN: [id number] Arrival date & time: 07/18/20  1714     History Chief Complaint  Patient presents with  . Headache    Linda Lara is a 86 y.o. female.  Presents to ER with concern for numb sensation on her right head.  She reports that she has been having this on and off for the past few months.  States that it was isolated to just 1 small area but then over the past 2 days she feels like the area of numbness has spread to more of her head.  She has no numbness over her face.  She had a mild headache couple weeks ago and was told that she may have TMJ but states that she more recently has not had any associated pain.  No pain today, no neck pain or neck stiffness.  States that she has no facial droop, no change in speech, vision, no difficulty with walking.  HPI     Past Medical History:  Diagnosis Date  . A-fib (HCC)   . Arthritis   . Hyperlipemia     Patient Active Problem List   Diagnosis Date Noted  . IBS (irritable bowel syndrome) 05/17/2019    Past Surgical History:  Procedure Laterality Date  . CHOLECYSTECTOMY  03/2019  . COLONOSCOPY  2000  . ERCP  03/2019  . JOINT REPLACEMENT Bilateral   . REPLACEMENT TOTAL KNEE Bilateral   . TONSILLECTOMY    . YAG LASER APPLICATION Right 10/18/2012   Procedure: YAG LASER APPLICATION;  Surgeon: Susa Simmonds, MD;  Location: [facility];  Service: Ophthalmology;  Laterality: Right;     OB History   No obstetric history on file.     No family history on file.  Social History   Tobacco Use  . Smoking status: Never Smoker  . Smokeless tobacco: Never Used  Substance Use Topics  . Alcohol use: Never  . Drug use: Never    Home Medications Prior to Admission medications   Medication Sig Start Date End Date Taking? Authorizing Provider  acetaminophen (TYLENOL) 500 MG tablet Take by mouth. 03/18/19   [provider]  cyclobenzaprine (FLEXERIL) 5 MG  tablet SMARTSIG:1 Tablet(s) By Mouth Every Evening PRN 07/16/20   [provider]  ELIQUIS 5 MG TABS tablet Take 5 mg by mouth 2 (two) times daily. 06/03/19   [provider]  metoprolol succinate (TOPROL-XL) 100 MG 24 hr tablet Take by mouth.    [provider]  potassium chloride SA (KLOR-CON) 20 MEQ tablet Take 1 tablet (20 mEq total) by mouth daily. 06/20/19   Burgess Amor, PA-C  sertraline (ZOLOFT) 50 MG tablet Take 50 mg by mouth daily. 07/13/20   [provider]  simvastatin (ZOCOR) 40 MG tablet Take 40 mg by mouth every evening.    [provider]  triamterene-hydrochlorothiazide (DYAZIDE) 37.5-25 MG per capsule Take 1 capsule by mouth every morning.    [provider]  dicyclomine (BENTYL) 10 MG capsule Take one cap up to twice daily as needed for abdominal cramps/diarrhea. Hold for constipation. 05/17/19 06/20/19  Tiffany Kocher, PA-C    Allergies    Celebrex [celecoxib] and Sulfa antibiotics  Review of Systems   Review of Systems  Constitutional: Negative for chills and fever.  HENT: Negative for ear pain and sore throat.   Eyes: Negative for pain and visual disturbance.  Respiratory: Negative for cough and shortness of breath.   Cardiovascular:  Negative for chest pain and palpitations.  Gastrointestinal: Negative for abdominal pain and vomiting.  Genitourinary: Negative for dysuria and hematuria.  Musculoskeletal: Negative for arthralgias and back pain.  Skin: Negative for color change and rash.  Neurological: Positive for numbness. Negative for seizures and syncope.  All other systems reviewed and are negative.   Physical Exam Updated Vital Signs BP 103/64 (BP Location: Right Arm)   Pulse 64   Temp 98 F (36.7 C) (Oral)   Resp 18   Ht 6' (1.829 m)   Wt 76.7 kg   SpO2 96%   BMI 22.92 kg/m   Physical Exam Vitals and nursing note reviewed.  Constitutional:      General: She is not in acute distress.     Appearance: She is well-developed.  HENT:     Head: Normocephalic and atraumatic.     Comments: No tenderness over no tenderness over temporal region Eyes:     Conjunctiva/sclera: Conjunctivae normal.  Cardiovascular:     Rate and Rhythm: Normal rate and regular rhythm.     Heart sounds: No murmur heard.   Pulmonary:     Effort: Pulmonary effort is normal. No respiratory distress.     Breath sounds: Normal breath sounds.  Abdominal:     Palpations: Abdomen is soft.     Tenderness: There is no abdominal tenderness.  Musculoskeletal:     Cervical back: Neck supple.  Skin:    General: Skin is warm and dry.  Neurological:     Mental Status: She is alert.     Comments: AAOx3 CN 2-12 intact, speech clear visual fields intact 5/5 strength in b/l UE and LE Sensation to light touch intact in b/l UE and LE Normal FNF Normal gait  Psychiatric:        Mood and Affect: Mood normal.        Behavior: Behavior normal.     ED Results / Procedures / Treatments   Labs (all labs ordered are listed, but only abnormal results are displayed) Labs Reviewed  BASIC METABOLIC PANEL - Abnormal; Notable for the following components:      Result Value   Glucose, Bld 107 (*)    BUN 27 (*)    Creatinine, Ser 1.44 (*)    GFR, Estimated 34 (*)    All other components within normal limits  CBC WITH DIFFERENTIAL/PLATELET    EKG None  Radiology CT Head Wo Contrast  Result Date: 07/18/2020 CLINICAL DATA:  86 year old female with neurologic deficit. EXAM: CT HEAD WITHOUT CONTRAST TECHNIQUE: Contiguous axial images were obtained from the base of the skull through the vertex without intravenous contrast. COMPARISON:  Head CT dated 07/01/2019. FINDINGS: Brain: There is mild age-related atrophy and chronic microvascular ischemic changes. Bilateral hemispheric low attenuating subdural collections similar to prior CT and measuring up to 1 cm in thickness over the left frontal convexity, likely old hygroma.  There is associated mild mass effect on the brain. No midline shift. There is no acute intracranial hemorrhage. Vascular: No hyperdense vessel or unexpected calcification. Skull: Normal. Negative for fracture or focal lesion. Sinuses/Orbits: There is diffuse mucoperiosteal thickening of the maxillary sinuses. No air-fluid level. The mastoid air cells are clear. Other: None IMPRESSION: 1. No acute intracranial hemorrhage. 2. Age-related atrophy and chronic microvascular ischemic changes. 3. Stable bilateral hemispheric subdural collections, likely old hygroma. Electronically Signed   By: Elgie Collard M.D.   On: 07/18/2020 19:18    Procedures Procedures (including critical care time)  Medications  Ordered in ED Medications - No data to display  ED Course  I have reviewed the triage vital signs and the nursing notes.  Pertinent labs & imaging results that were available during my care of the patient were reviewed by me and considered in my medical decision making (see chart for details).    MDM Rules/Calculators/A&P                         86 year old lady presents to ER with concern for numb sensation on the right side of her head. Face not involved.  She has no focal neurologic deficits on my exam.  No tenderness over temporal region.  No pain today.  CT head negative, radiologist commented on likely old hygroma.  Discussed finding with patient.  Basic labs within normal limits except noted mild elevation in her creatinine.  Recommend patient follow-up with her primary doctor, monitor creatinine outpatient.  Reviewed return precautions and discharged home.   After the discussed management above, the patient was determined to be safe for discharge.  The patient was in agreement with this plan and all questions regarding their care were answered.  ED return precautions were discussed and the patient will return to the ED with any significant worsening of condition.  Final Clinical Impression(s)  / ED Diagnoses Final diagnoses:  Nonintractable headache, unspecified chronicity pattern, unspecified headache type  Creatinine elevation    Rx / DC Orders ED Discharge Orders    None       Milagros Loll, MD 07/20/20 [id number]

## 2020-08-15 DIAGNOSIS — H401132 Primary open-angle glaucoma, bilateral, moderate stage: Secondary | ICD-10-CM | POA: Diagnosis not present

## 2020-08-15 DIAGNOSIS — H43813 Vitreous degeneration, bilateral: Secondary | ICD-10-CM | POA: Diagnosis not present

## 2020-08-15 DIAGNOSIS — Z961 Presence of intraocular lens: Secondary | ICD-10-CM | POA: Diagnosis not present

## 2020-08-15 DIAGNOSIS — H31023 Solar retinopathy, bilateral: Secondary | ICD-10-CM | POA: Diagnosis not present

## 2020-09-18 DIAGNOSIS — I10 Essential (primary) hypertension: Secondary | ICD-10-CM | POA: Diagnosis not present

## 2020-09-18 DIAGNOSIS — E876 Hypokalemia: Secondary | ICD-10-CM | POA: Diagnosis not present

## 2020-09-18 DIAGNOSIS — N183 Chronic kidney disease, stage 3 unspecified: Secondary | ICD-10-CM | POA: Diagnosis not present

## 2020-09-18 DIAGNOSIS — R5382 Chronic fatigue, unspecified: Secondary | ICD-10-CM | POA: Diagnosis not present

## 2020-09-20 DIAGNOSIS — E7849 Other hyperlipidemia: Secondary | ICD-10-CM | POA: Diagnosis not present

## 2020-09-20 DIAGNOSIS — I10 Essential (primary) hypertension: Secondary | ICD-10-CM | POA: Diagnosis not present

## 2020-09-20 DIAGNOSIS — M19041 Primary osteoarthritis, right hand: Secondary | ICD-10-CM | POA: Diagnosis not present

## 2020-09-20 DIAGNOSIS — M19042 Primary osteoarthritis, left hand: Secondary | ICD-10-CM | POA: Diagnosis not present

## 2020-12-17 DIAGNOSIS — E782 Mixed hyperlipidemia: Secondary | ICD-10-CM | POA: Diagnosis not present

## 2020-12-17 DIAGNOSIS — N183 Chronic kidney disease, stage 3 unspecified: Secondary | ICD-10-CM | POA: Diagnosis not present

## 2020-12-17 DIAGNOSIS — E7849 Other hyperlipidemia: Secondary | ICD-10-CM | POA: Diagnosis not present

## 2020-12-17 DIAGNOSIS — I10 Essential (primary) hypertension: Secondary | ICD-10-CM | POA: Diagnosis not present

## 2020-12-20 DIAGNOSIS — E7849 Other hyperlipidemia: Secondary | ICD-10-CM | POA: Diagnosis not present

## 2020-12-20 DIAGNOSIS — I4891 Unspecified atrial fibrillation: Secondary | ICD-10-CM | POA: Diagnosis not present

## 2020-12-20 DIAGNOSIS — I10 Essential (primary) hypertension: Secondary | ICD-10-CM | POA: Diagnosis not present

## 2020-12-20 DIAGNOSIS — Z0001 Encounter for general adult medical examination with abnormal findings: Secondary | ICD-10-CM | POA: Diagnosis not present

## 2021-02-11 DIAGNOSIS — H401132 Primary open-angle glaucoma, bilateral, moderate stage: Secondary | ICD-10-CM | POA: Diagnosis not present

## 2021-02-11 DIAGNOSIS — H43813 Vitreous degeneration, bilateral: Secondary | ICD-10-CM | POA: Diagnosis not present

## 2021-02-11 DIAGNOSIS — H31023 Solar retinopathy, bilateral: Secondary | ICD-10-CM | POA: Diagnosis not present

## 2021-02-11 DIAGNOSIS — Z961 Presence of intraocular lens: Secondary | ICD-10-CM | POA: Diagnosis not present

## 2021-04-16 DIAGNOSIS — E7849 Other hyperlipidemia: Secondary | ICD-10-CM | POA: Diagnosis not present

## 2021-04-16 DIAGNOSIS — E782 Mixed hyperlipidemia: Secondary | ICD-10-CM | POA: Diagnosis not present

## 2021-04-16 DIAGNOSIS — N1832 Chronic kidney disease, stage 3b: Secondary | ICD-10-CM | POA: Diagnosis not present

## 2021-04-16 DIAGNOSIS — E876 Hypokalemia: Secondary | ICD-10-CM | POA: Diagnosis not present

## 2021-04-18 DIAGNOSIS — M19041 Primary osteoarthritis, right hand: Secondary | ICD-10-CM | POA: Diagnosis not present

## 2021-04-18 DIAGNOSIS — I10 Essential (primary) hypertension: Secondary | ICD-10-CM | POA: Diagnosis not present

## 2021-04-18 DIAGNOSIS — M19042 Primary osteoarthritis, left hand: Secondary | ICD-10-CM | POA: Diagnosis not present

## 2021-04-18 DIAGNOSIS — E7849 Other hyperlipidemia: Secondary | ICD-10-CM | POA: Diagnosis not present

## 2021-06-11 DIAGNOSIS — Z961 Presence of intraocular lens: Secondary | ICD-10-CM | POA: Diagnosis not present

## 2021-06-11 DIAGNOSIS — H401132 Primary open-angle glaucoma, bilateral, moderate stage: Secondary | ICD-10-CM | POA: Diagnosis not present

## 2021-06-11 DIAGNOSIS — H43813 Vitreous degeneration, bilateral: Secondary | ICD-10-CM | POA: Diagnosis not present

## 2021-06-11 DIAGNOSIS — H31023 Solar retinopathy, bilateral: Secondary | ICD-10-CM | POA: Diagnosis not present

## 2021-08-13 DIAGNOSIS — E042 Nontoxic multinodular goiter: Secondary | ICD-10-CM | POA: Diagnosis not present

## 2021-08-13 DIAGNOSIS — Z1329 Encounter for screening for other suspected endocrine disorder: Secondary | ICD-10-CM | POA: Diagnosis not present

## 2021-08-13 DIAGNOSIS — E782 Mixed hyperlipidemia: Secondary | ICD-10-CM | POA: Diagnosis not present

## 2021-08-13 DIAGNOSIS — N183 Chronic kidney disease, stage 3 unspecified: Secondary | ICD-10-CM | POA: Diagnosis not present

## 2021-08-15 DIAGNOSIS — E7849 Other hyperlipidemia: Secondary | ICD-10-CM | POA: Diagnosis not present

## 2021-08-15 DIAGNOSIS — I10 Essential (primary) hypertension: Secondary | ICD-10-CM | POA: Diagnosis not present

## 2021-08-15 DIAGNOSIS — M19041 Primary osteoarthritis, right hand: Secondary | ICD-10-CM | POA: Diagnosis not present

## 2021-08-15 DIAGNOSIS — M19042 Primary osteoarthritis, left hand: Secondary | ICD-10-CM | POA: Diagnosis not present

## 2021-09-02 DIAGNOSIS — S39012A Strain of muscle, fascia and tendon of lower back, initial encounter: Secondary | ICD-10-CM | POA: Diagnosis not present

## 2021-09-02 DIAGNOSIS — Z6824 Body mass index (BMI) 24.0-24.9, adult: Secondary | ICD-10-CM | POA: Diagnosis not present

## 2021-10-09 DIAGNOSIS — H401132 Primary open-angle glaucoma, bilateral, moderate stage: Secondary | ICD-10-CM | POA: Diagnosis not present

## 2021-10-09 DIAGNOSIS — Z961 Presence of intraocular lens: Secondary | ICD-10-CM | POA: Diagnosis not present

## 2021-10-09 DIAGNOSIS — H43813 Vitreous degeneration, bilateral: Secondary | ICD-10-CM | POA: Diagnosis not present

## 2021-10-09 DIAGNOSIS — H31023 Solar retinopathy, bilateral: Secondary | ICD-10-CM | POA: Diagnosis not present

## 2022-01-08 DIAGNOSIS — E7849 Other hyperlipidemia: Secondary | ICD-10-CM | POA: Diagnosis not present

## 2022-01-08 DIAGNOSIS — E042 Nontoxic multinodular goiter: Secondary | ICD-10-CM | POA: Diagnosis not present

## 2022-01-08 DIAGNOSIS — I10 Essential (primary) hypertension: Secondary | ICD-10-CM | POA: Diagnosis not present

## 2022-01-08 DIAGNOSIS — N1832 Chronic kidney disease, stage 3b: Secondary | ICD-10-CM | POA: Diagnosis not present

## 2022-01-08 DIAGNOSIS — E782 Mixed hyperlipidemia: Secondary | ICD-10-CM | POA: Diagnosis not present

## 2022-01-13 DIAGNOSIS — Z0001 Encounter for general adult medical examination with abnormal findings: Secondary | ICD-10-CM | POA: Diagnosis not present

## 2022-01-13 DIAGNOSIS — I4891 Unspecified atrial fibrillation: Secondary | ICD-10-CM | POA: Diagnosis not present

## 2022-01-13 DIAGNOSIS — I10 Essential (primary) hypertension: Secondary | ICD-10-CM | POA: Diagnosis not present

## 2022-01-13 DIAGNOSIS — E7849 Other hyperlipidemia: Secondary | ICD-10-CM | POA: Diagnosis not present

## 2022-01-14 DIAGNOSIS — Z9049 Acquired absence of other specified parts of digestive tract: Secondary | ICD-10-CM | POA: Diagnosis not present

## 2022-01-14 DIAGNOSIS — I10 Essential (primary) hypertension: Secondary | ICD-10-CM | POA: Diagnosis not present

## 2022-01-14 DIAGNOSIS — E785 Hyperlipidemia, unspecified: Secondary | ICD-10-CM | POA: Diagnosis not present

## 2022-01-14 DIAGNOSIS — K580 Irritable bowel syndrome with diarrhea: Secondary | ICD-10-CM | POA: Diagnosis not present

## 2022-01-14 DIAGNOSIS — K529 Noninfective gastroenteritis and colitis, unspecified: Secondary | ICD-10-CM | POA: Diagnosis not present

## 2022-02-11 DIAGNOSIS — H02052 Trichiasis without entropian right lower eyelid: Secondary | ICD-10-CM | POA: Diagnosis not present

## 2022-02-11 DIAGNOSIS — H43813 Vitreous degeneration, bilateral: Secondary | ICD-10-CM | POA: Diagnosis not present

## 2022-02-11 DIAGNOSIS — H31023 Solar retinopathy, bilateral: Secondary | ICD-10-CM | POA: Diagnosis not present

## 2022-02-11 DIAGNOSIS — H401132 Primary open-angle glaucoma, bilateral, moderate stage: Secondary | ICD-10-CM | POA: Diagnosis not present

## 2022-03-13 DIAGNOSIS — H5310 Unspecified subjective visual disturbances: Secondary | ICD-10-CM | POA: Diagnosis not present

## 2022-03-13 DIAGNOSIS — H31023 Solar retinopathy, bilateral: Secondary | ICD-10-CM | POA: Diagnosis not present

## 2022-03-14 DIAGNOSIS — H401132 Primary open-angle glaucoma, bilateral, moderate stage: Secondary | ICD-10-CM | POA: Diagnosis not present

## 2022-03-14 DIAGNOSIS — H43813 Vitreous degeneration, bilateral: Secondary | ICD-10-CM | POA: Diagnosis not present

## 2022-03-14 DIAGNOSIS — H43393 Other vitreous opacities, bilateral: Secondary | ICD-10-CM | POA: Diagnosis not present

## 2022-03-14 DIAGNOSIS — H15833 Staphyloma posticum, bilateral: Secondary | ICD-10-CM | POA: Diagnosis not present

## 2022-05-13 DIAGNOSIS — Z1329 Encounter for screening for other suspected endocrine disorder: Secondary | ICD-10-CM | POA: Diagnosis not present

## 2022-05-13 DIAGNOSIS — I10 Essential (primary) hypertension: Secondary | ICD-10-CM | POA: Diagnosis not present

## 2022-05-13 DIAGNOSIS — E7849 Other hyperlipidemia: Secondary | ICD-10-CM | POA: Diagnosis not present

## 2022-05-13 DIAGNOSIS — N183 Chronic kidney disease, stage 3 unspecified: Secondary | ICD-10-CM | POA: Diagnosis not present

## 2022-05-16 DIAGNOSIS — I10 Essential (primary) hypertension: Secondary | ICD-10-CM | POA: Diagnosis not present

## 2022-05-16 DIAGNOSIS — M19041 Primary osteoarthritis, right hand: Secondary | ICD-10-CM | POA: Diagnosis not present

## 2022-05-16 DIAGNOSIS — E7849 Other hyperlipidemia: Secondary | ICD-10-CM | POA: Diagnosis not present

## 2022-05-16 DIAGNOSIS — M19042 Primary osteoarthritis, left hand: Secondary | ICD-10-CM | POA: Diagnosis not present

## 2022-06-06 DIAGNOSIS — Z79899 Other long term (current) drug therapy: Secondary | ICD-10-CM | POA: Diagnosis not present

## 2022-06-06 DIAGNOSIS — R002 Palpitations: Secondary | ICD-10-CM | POA: Diagnosis not present

## 2022-06-06 DIAGNOSIS — R9431 Abnormal electrocardiogram [ECG] [EKG]: Secondary | ICD-10-CM | POA: Diagnosis not present

## 2022-06-06 DIAGNOSIS — R531 Weakness: Secondary | ICD-10-CM | POA: Diagnosis not present

## 2022-06-06 DIAGNOSIS — I4891 Unspecified atrial fibrillation: Secondary | ICD-10-CM | POA: Diagnosis not present

## 2022-06-06 DIAGNOSIS — I700 Atherosclerosis of aorta: Secondary | ICD-10-CM | POA: Diagnosis not present

## 2022-06-06 DIAGNOSIS — Z7901 Long term (current) use of anticoagulants: Secondary | ICD-10-CM | POA: Diagnosis not present

## 2022-06-06 DIAGNOSIS — R079 Chest pain, unspecified: Secondary | ICD-10-CM | POA: Diagnosis not present

## 2022-06-06 DIAGNOSIS — J439 Emphysema, unspecified: Secondary | ICD-10-CM | POA: Diagnosis not present

## 2022-06-16 DIAGNOSIS — I482 Chronic atrial fibrillation, unspecified: Secondary | ICD-10-CM | POA: Diagnosis not present

## 2022-06-16 DIAGNOSIS — Z23 Encounter for immunization: Secondary | ICD-10-CM | POA: Diagnosis not present

## 2022-06-16 DIAGNOSIS — Z6825 Body mass index (BMI) 25.0-25.9, adult: Secondary | ICD-10-CM | POA: Diagnosis not present

## 2022-06-18 DIAGNOSIS — H02052 Trichiasis without entropian right lower eyelid: Secondary | ICD-10-CM | POA: Diagnosis not present

## 2022-06-18 DIAGNOSIS — H401132 Primary open-angle glaucoma, bilateral, moderate stage: Secondary | ICD-10-CM | POA: Diagnosis not present

## 2022-06-18 DIAGNOSIS — H43813 Vitreous degeneration, bilateral: Secondary | ICD-10-CM | POA: Diagnosis not present

## 2022-06-18 DIAGNOSIS — H31023 Solar retinopathy, bilateral: Secondary | ICD-10-CM | POA: Diagnosis not present

## 2022-07-16 DIAGNOSIS — R300 Dysuria: Secondary | ICD-10-CM | POA: Diagnosis not present

## 2022-11-11 IMAGING — CT CT HEAD W/O CM
3 series · 16 of 47 positions shown, 19 images · non-contrast
Comparison: Head CT dated 07/01/2019.

CLINICAL DATA: [AGE] female with neurologic deficit.

EXAM:
CT HEAD WITHOUT CONTRAST
TECHNIQUE: Contiguous axial images were obtained from the base of the skull
through the vertex without intravenous contrast.

[Series 2: head wo · axial · 0.42mm/px · z∈[-373,-238]mm · 10 of 33 slices shown, 13 images]
[im 3/33  brain]
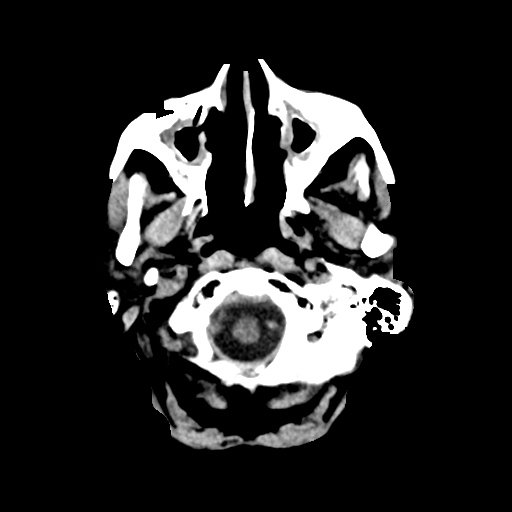
[im 3/33  bone]
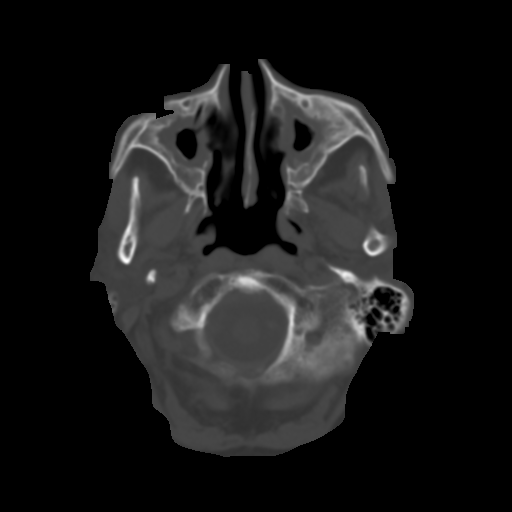
[im 6/33  brain]
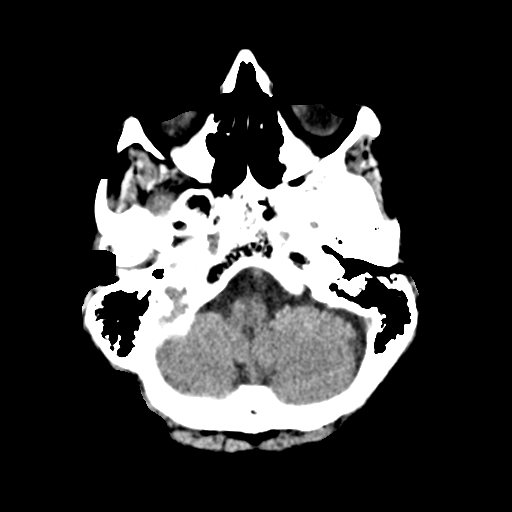
[im 9/33  brain]
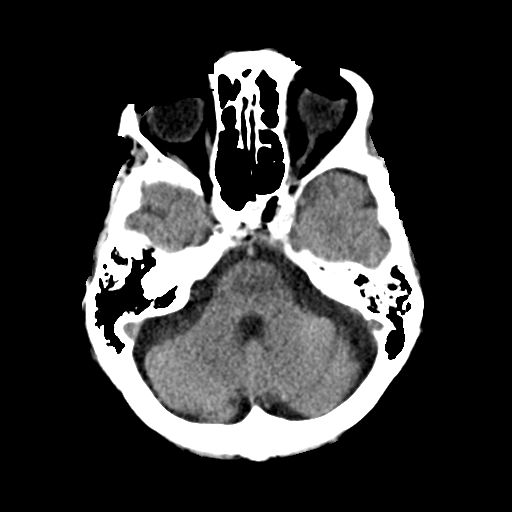
[im 12/33  brain]
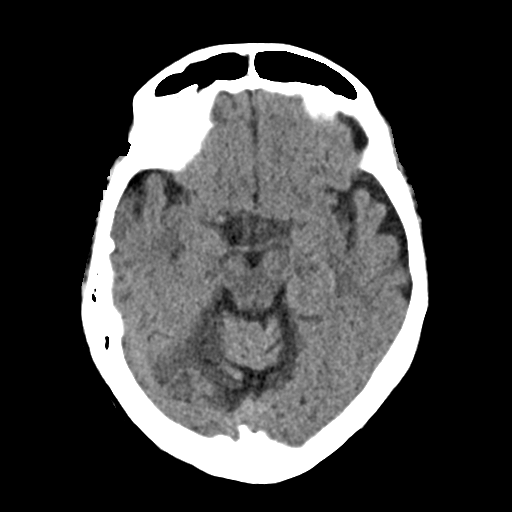
[im 15/33  brain]
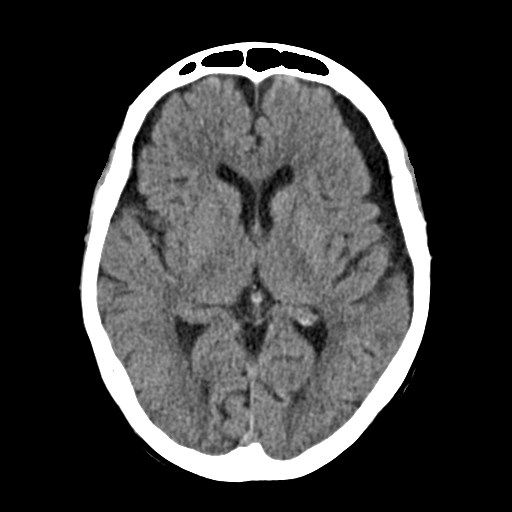
[im 15/33  bone]
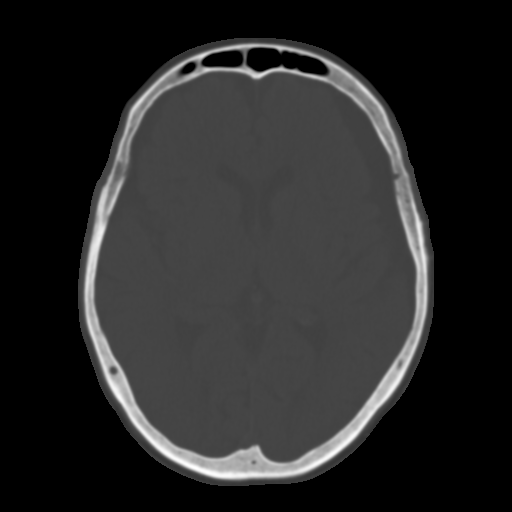
[im 18/33  brain]
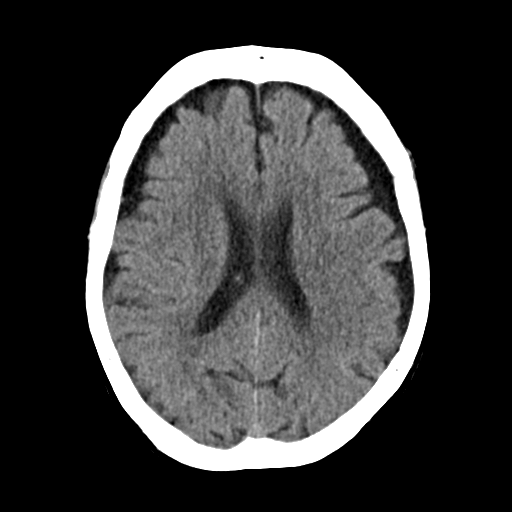
[im 21/33  brain]
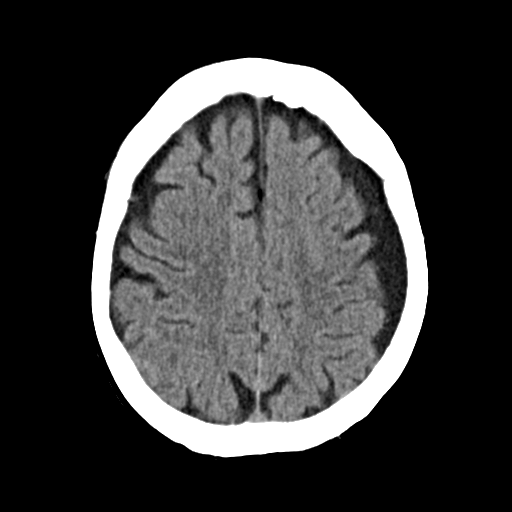
[im 25/33  brain]
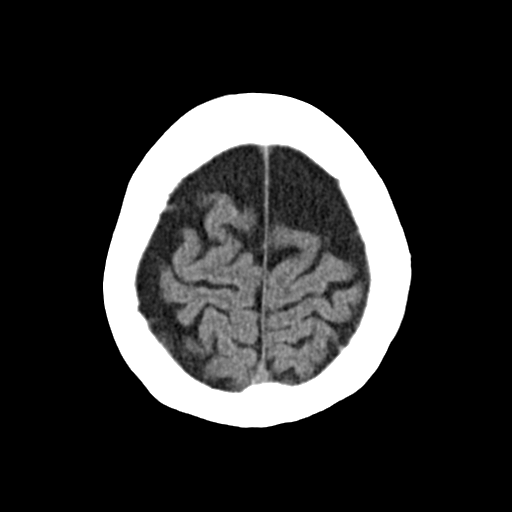
[im 27/33  brain]
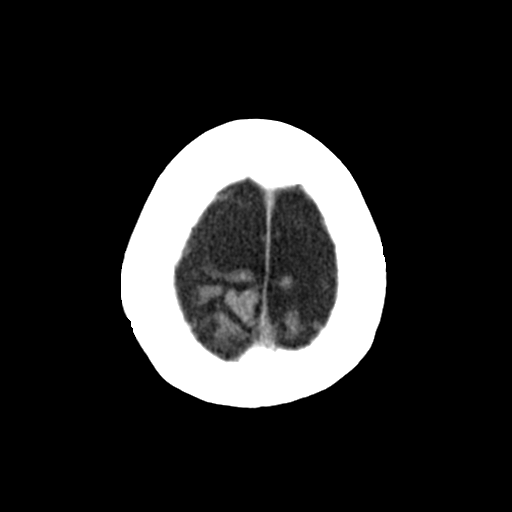
[im 27/33  bone]
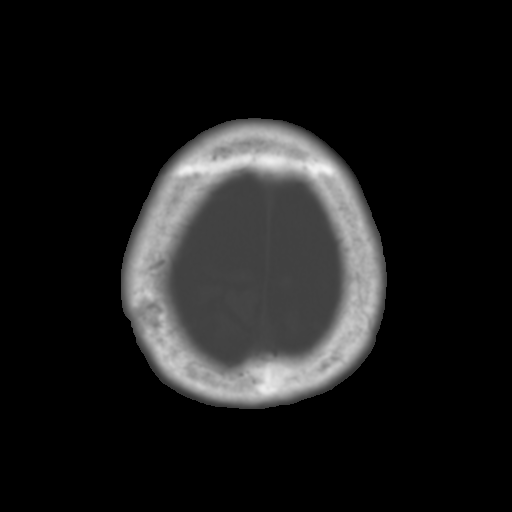
[im 30/33  brain]
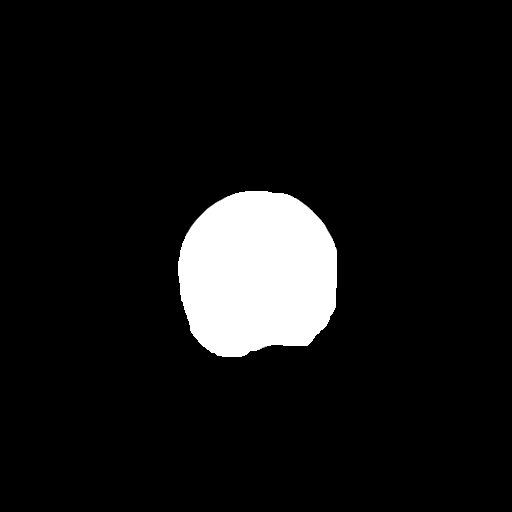

[Series 4: coronal soft · coronal · 0.31mm/px · 3 of 67 slices shown]
[im 23/67  brain]
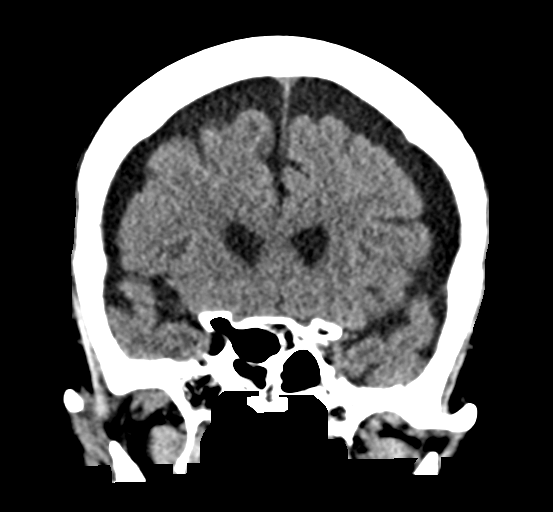
[im 30/67  brain]
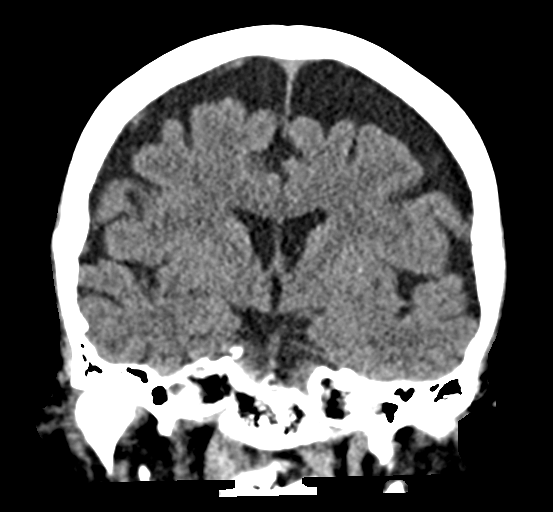
[im 37/67  brain]
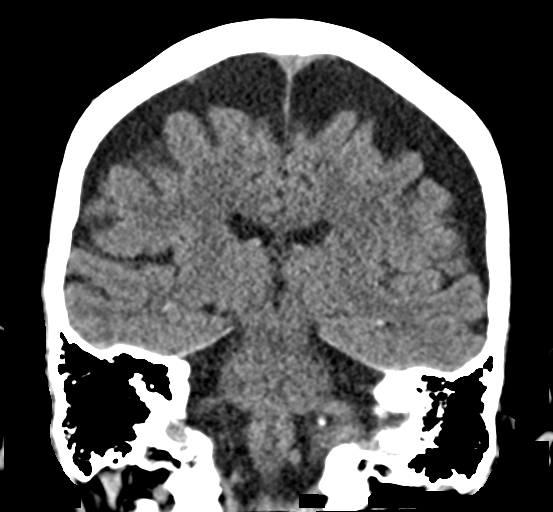

[Series 5: sag soft · sagittal · 0.33mm/px · 3 of 60 slices shown]
[im 20/60  brain]
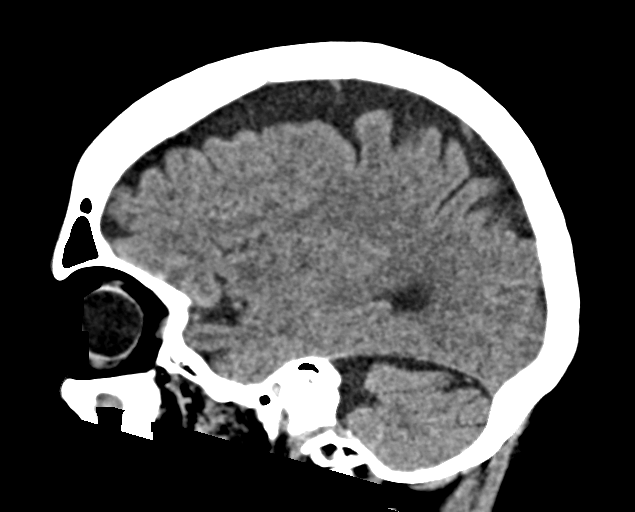
[im 30/60  brain]
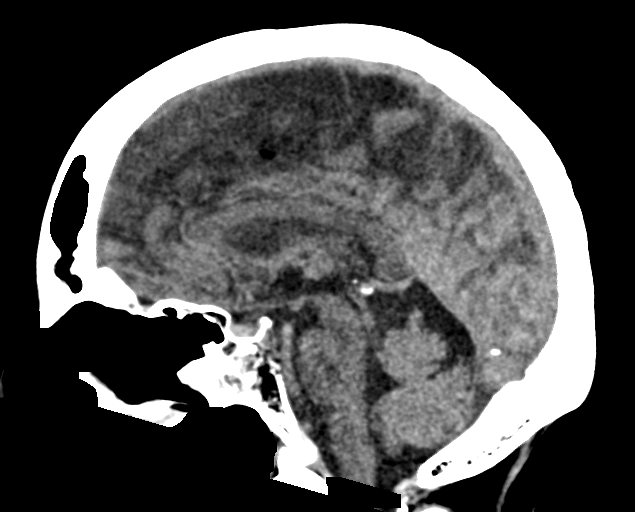
[im 40/60  brain]
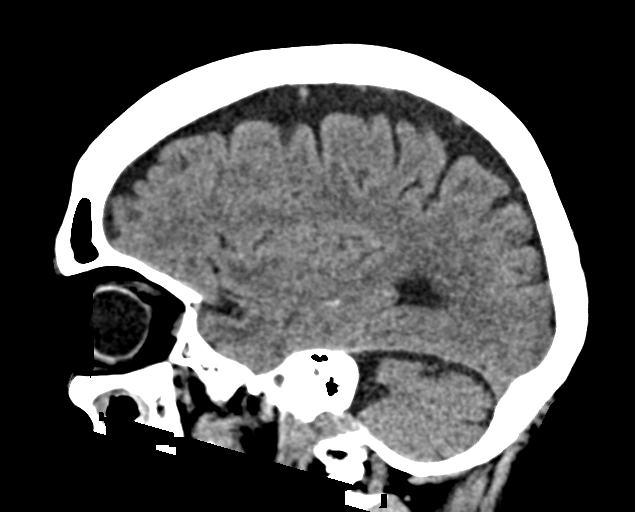

[16 of 47 positions shown; findings below may reference images not displayed]

FINDINGS: Brain: There is mild age-related atrophy and chronic microvascular
ischemic changes. Bilateral hemispheric low attenuating subdural
collections similar to prior CT and measuring up to 1 cm in
thickness over the left frontal convexity, likely old hygroma. There
is associated mild mass effect on the brain. No midline shift. There
is no acute intracranial hemorrhage.

Vascular: No hyperdense vessel or unexpected calcification.

Skull: Normal. Negative for fracture or focal lesion.

Sinuses/Orbits: There is diffuse mucoperiosteal thickening of the
maxillary sinuses. No air-fluid level. The mastoid air cells are
clear.

Other: None
IMPRESSION: 1. No acute intracranial hemorrhage.
2. Age-related atrophy and chronic microvascular ischemic changes.
3. Stable bilateral hemispheric subdural collections, likely old
hygroma.

## 2023-08-02 DEATH — deceased
